# Patient Record
Sex: Female | Born: 1965 | Race: Black or African American | Hispanic: No | Marital: Married | Smoking: Former smoker
Health system: Southern US, Community
[De-identification: ages and names within clinical notes are randomized; demographics above are authoritative.]

## PROBLEM LIST (undated history)

## (undated) DIAGNOSIS — F410 Panic disorder [episodic paroxysmal anxiety] without agoraphobia: Secondary | ICD-10-CM

## (undated) DIAGNOSIS — F419 Anxiety disorder, unspecified: Secondary | ICD-10-CM

## (undated) HISTORY — PX: OTHER SURGICAL HISTORY: SHX169

## (undated) HISTORY — PX: CARPAL TUNNEL RELEASE: SHX101

---

## 1997-11-15 ENCOUNTER — Inpatient Hospital Stay (HOSPITAL_COMMUNITY): Admission: AD | Admit: 1997-11-15 | Discharge: 1997-11-15 | Payer: Self-pay | Admitting: Obstetrics & Gynecology

## 1998-05-14 ENCOUNTER — Emergency Department (HOSPITAL_COMMUNITY): Admission: EM | Admit: 1998-05-14 | Discharge: 1998-05-14 | Payer: Self-pay | Admitting: Emergency Medicine

## 1998-08-07 ENCOUNTER — Encounter: Admission: RE | Admit: 1998-08-07 | Discharge: 1998-08-07 | Payer: Self-pay | Admitting: Family Medicine

## 1998-12-10 ENCOUNTER — Inpatient Hospital Stay (HOSPITAL_COMMUNITY): Admission: AD | Admit: 1998-12-10 | Discharge: 1998-12-10 | Payer: Self-pay | Admitting: *Deleted

## 1998-12-18 ENCOUNTER — Encounter: Admission: RE | Admit: 1998-12-18 | Discharge: 1998-12-18 | Payer: Self-pay | Admitting: Family Medicine

## 1999-01-07 ENCOUNTER — Other Ambulatory Visit: Admission: RE | Admit: 1999-01-07 | Discharge: 1999-01-07 | Payer: Self-pay

## 1999-01-07 ENCOUNTER — Encounter: Admission: RE | Admit: 1999-01-07 | Discharge: 1999-01-07 | Payer: Self-pay | Admitting: Family Medicine

## 1999-02-03 ENCOUNTER — Encounter: Admission: RE | Admit: 1999-02-03 | Discharge: 1999-02-03 | Payer: Self-pay | Admitting: Family Medicine

## 1999-02-06 ENCOUNTER — Ambulatory Visit (HOSPITAL_COMMUNITY): Admission: RE | Admit: 1999-02-06 | Discharge: 1999-02-06 | Payer: Self-pay | Admitting: *Deleted

## 1999-02-18 ENCOUNTER — Encounter: Admission: RE | Admit: 1999-02-18 | Discharge: 1999-02-18 | Payer: Self-pay | Admitting: Family Medicine

## 1999-03-30 ENCOUNTER — Observation Stay (HOSPITAL_COMMUNITY): Admission: AD | Admit: 1999-03-30 | Discharge: 1999-03-31 | Payer: Self-pay | Admitting: Obstetrics

## 1999-04-03 ENCOUNTER — Encounter: Admission: RE | Admit: 1999-04-03 | Discharge: 1999-04-03 | Payer: Self-pay | Admitting: Family Medicine

## 1999-04-03 ENCOUNTER — Ambulatory Visit (HOSPITAL_COMMUNITY): Admission: RE | Admit: 1999-04-03 | Discharge: 1999-04-03 | Payer: Self-pay | Admitting: Obstetrics

## 1999-04-17 ENCOUNTER — Encounter: Admission: RE | Admit: 1999-04-17 | Discharge: 1999-04-17 | Payer: Self-pay | Admitting: Family Medicine

## 1999-05-02 ENCOUNTER — Encounter: Admission: RE | Admit: 1999-05-02 | Discharge: 1999-05-02 | Payer: Self-pay | Admitting: Family Medicine

## 1999-05-16 ENCOUNTER — Encounter: Admission: RE | Admit: 1999-05-16 | Discharge: 1999-05-16 | Payer: Self-pay | Admitting: Family Medicine

## 1999-05-20 ENCOUNTER — Inpatient Hospital Stay (HOSPITAL_COMMUNITY): Admission: AD | Admit: 1999-05-20 | Discharge: 1999-05-28 | Payer: Self-pay | Admitting: Obstetrics

## 1999-05-20 ENCOUNTER — Encounter: Payer: Self-pay | Admitting: *Deleted

## 1999-05-23 ENCOUNTER — Encounter: Payer: Self-pay | Admitting: *Deleted

## 1999-05-30 ENCOUNTER — Encounter: Admission: RE | Admit: 1999-05-30 | Discharge: 1999-05-30 | Payer: Self-pay | Admitting: Family Medicine

## 1999-06-05 ENCOUNTER — Encounter: Admission: RE | Admit: 1999-06-05 | Discharge: 1999-06-05 | Payer: Self-pay | Admitting: Obstetrics

## 1999-06-12 ENCOUNTER — Inpatient Hospital Stay (HOSPITAL_COMMUNITY): Admission: AD | Admit: 1999-06-12 | Discharge: 1999-06-12 | Payer: Self-pay | Admitting: Obstetrics & Gynecology

## 1999-06-12 ENCOUNTER — Encounter: Admission: RE | Admit: 1999-06-12 | Discharge: 1999-06-12 | Payer: Self-pay | Admitting: Obstetrics

## 1999-06-15 ENCOUNTER — Encounter (INDEPENDENT_AMBULATORY_CARE_PROVIDER_SITE_OTHER): Payer: Self-pay | Admitting: Specialist

## 1999-06-15 ENCOUNTER — Encounter: Payer: Self-pay | Admitting: Obstetrics & Gynecology

## 1999-06-15 ENCOUNTER — Inpatient Hospital Stay (HOSPITAL_COMMUNITY): Admission: AD | Admit: 1999-06-15 | Discharge: 1999-06-18 | Payer: Self-pay | Admitting: Obstetrics & Gynecology

## 1999-11-06 ENCOUNTER — Encounter: Admission: RE | Admit: 1999-11-06 | Discharge: 1999-11-06 | Payer: Self-pay | Admitting: Family Medicine

## 2000-10-11 ENCOUNTER — Emergency Department (HOSPITAL_COMMUNITY): Admission: EM | Admit: 2000-10-11 | Discharge: 2000-10-12 | Payer: Self-pay | Admitting: Emergency Medicine

## 2002-07-07 ENCOUNTER — Encounter: Admission: RE | Admit: 2002-07-07 | Discharge: 2002-07-07 | Payer: Self-pay | Admitting: Family Medicine

## 2002-07-07 ENCOUNTER — Other Ambulatory Visit: Admission: RE | Admit: 2002-07-07 | Discharge: 2002-07-28 | Payer: Self-pay | Admitting: Family Medicine

## 2002-07-10 ENCOUNTER — Encounter: Admission: RE | Admit: 2002-07-10 | Discharge: 2002-07-10 | Payer: Self-pay | Admitting: Family Medicine

## 2003-08-11 ENCOUNTER — Emergency Department (HOSPITAL_COMMUNITY): Admission: EM | Admit: 2003-08-11 | Discharge: 2003-08-11 | Payer: Self-pay | Admitting: Emergency Medicine

## 2003-10-03 ENCOUNTER — Encounter: Admission: RE | Admit: 2003-10-03 | Discharge: 2003-10-03 | Payer: Self-pay | Admitting: Family Medicine

## 2003-10-11 ENCOUNTER — Encounter: Admission: RE | Admit: 2003-10-11 | Discharge: 2003-10-11 | Payer: Self-pay | Admitting: Sports Medicine

## 2004-02-15 ENCOUNTER — Other Ambulatory Visit: Admission: RE | Admit: 2004-02-15 | Discharge: 2004-02-15 | Payer: Self-pay | Admitting: Sports Medicine

## 2004-02-15 ENCOUNTER — Other Ambulatory Visit: Admission: RE | Admit: 2004-02-15 | Discharge: 2004-02-15 | Payer: Self-pay | Admitting: Family Medicine

## 2004-02-15 ENCOUNTER — Encounter (INDEPENDENT_AMBULATORY_CARE_PROVIDER_SITE_OTHER): Payer: Self-pay | Admitting: Specialist

## 2004-02-15 ENCOUNTER — Encounter: Admission: RE | Admit: 2004-02-15 | Discharge: 2004-02-15 | Payer: Self-pay | Admitting: Sports Medicine

## 2004-04-01 ENCOUNTER — Emergency Department (HOSPITAL_COMMUNITY): Admission: EM | Admit: 2004-04-01 | Discharge: 2004-04-01 | Payer: Self-pay | Admitting: Emergency Medicine

## 2004-05-13 ENCOUNTER — Encounter: Admission: RE | Admit: 2004-05-13 | Discharge: 2004-05-13 | Payer: Self-pay | Admitting: Family Medicine

## 2004-11-11 ENCOUNTER — Emergency Department (HOSPITAL_COMMUNITY): Admission: EM | Admit: 2004-11-11 | Discharge: 2004-11-11 | Payer: Self-pay | Admitting: Emergency Medicine

## 2005-08-30 ENCOUNTER — Inpatient Hospital Stay (HOSPITAL_COMMUNITY): Admission: AD | Admit: 2005-08-30 | Discharge: 2005-08-30 | Payer: Self-pay | Admitting: *Deleted

## 2006-01-02 ENCOUNTER — Encounter (INDEPENDENT_AMBULATORY_CARE_PROVIDER_SITE_OTHER): Payer: Self-pay | Admitting: *Deleted

## 2006-01-02 LAB — CONVERTED CEMR LAB

## 2006-01-07 ENCOUNTER — Ambulatory Visit: Payer: Self-pay | Admitting: Family Medicine

## 2006-01-07 ENCOUNTER — Encounter (INDEPENDENT_AMBULATORY_CARE_PROVIDER_SITE_OTHER): Payer: Self-pay | Admitting: Specialist

## 2006-11-25 DIAGNOSIS — N879 Dysplasia of cervix uteri, unspecified: Secondary | ICD-10-CM | POA: Insufficient documentation

## 2006-11-25 DIAGNOSIS — R42 Dizziness and giddiness: Secondary | ICD-10-CM

## 2006-11-25 DIAGNOSIS — F17200 Nicotine dependence, unspecified, uncomplicated: Secondary | ICD-10-CM | POA: Insufficient documentation

## 2006-11-25 DIAGNOSIS — F411 Generalized anxiety disorder: Secondary | ICD-10-CM | POA: Insufficient documentation

## 2006-11-25 HISTORY — DX: Dizziness and giddiness: R42

## 2006-11-26 ENCOUNTER — Encounter (INDEPENDENT_AMBULATORY_CARE_PROVIDER_SITE_OTHER): Payer: Self-pay | Admitting: *Deleted

## 2007-01-17 ENCOUNTER — Telehealth (INDEPENDENT_AMBULATORY_CARE_PROVIDER_SITE_OTHER): Payer: Self-pay | Admitting: *Deleted

## 2007-01-18 ENCOUNTER — Encounter: Payer: Self-pay | Admitting: Family Medicine

## 2007-01-18 ENCOUNTER — Encounter (INDEPENDENT_AMBULATORY_CARE_PROVIDER_SITE_OTHER): Payer: Self-pay | Admitting: *Deleted

## 2007-01-18 ENCOUNTER — Ambulatory Visit: Payer: Self-pay | Admitting: Sports Medicine

## 2007-01-18 ENCOUNTER — Encounter: Payer: Self-pay | Admitting: *Deleted

## 2007-01-18 DIAGNOSIS — N898 Other specified noninflammatory disorders of vagina: Secondary | ICD-10-CM | POA: Insufficient documentation

## 2007-01-18 DIAGNOSIS — N926 Irregular menstruation, unspecified: Secondary | ICD-10-CM | POA: Insufficient documentation

## 2007-01-18 DIAGNOSIS — N879 Dysplasia of cervix uteri, unspecified: Secondary | ICD-10-CM | POA: Insufficient documentation

## 2007-01-18 HISTORY — DX: Irregular menstruation, unspecified: N92.6

## 2007-01-18 LAB — CONVERTED CEMR LAB
Beta hcg, urine, semiquantitative: NEGATIVE
Whiff Test: POSITIVE

## 2007-01-24 ENCOUNTER — Encounter: Payer: Self-pay | Admitting: *Deleted

## 2007-02-07 ENCOUNTER — Telehealth: Payer: Self-pay | Admitting: *Deleted

## 2007-02-09 ENCOUNTER — Ambulatory Visit: Payer: Self-pay | Admitting: Obstetrics & Gynecology

## 2007-02-14 ENCOUNTER — Ambulatory Visit: Payer: Self-pay | Admitting: Sports Medicine

## 2007-02-14 ENCOUNTER — Encounter (INDEPENDENT_AMBULATORY_CARE_PROVIDER_SITE_OTHER): Payer: Self-pay | Admitting: *Deleted

## 2007-02-14 DIAGNOSIS — F3289 Other specified depressive episodes: Secondary | ICD-10-CM | POA: Insufficient documentation

## 2007-02-14 DIAGNOSIS — R634 Abnormal weight loss: Secondary | ICD-10-CM

## 2007-02-14 DIAGNOSIS — F329 Major depressive disorder, single episode, unspecified: Secondary | ICD-10-CM

## 2007-02-14 LAB — CONVERTED CEMR LAB
ALT: 17 units/L (ref 0–35)
AST: 20 units/L (ref 0–37)
Albumin: 4.6 g/dL (ref 3.5–5.2)
Alkaline Phosphatase: 61 units/L (ref 39–117)
BUN: 12 mg/dL (ref 6–23)
CO2: 21 meq/L (ref 19–32)
Calcium: 9.6 mg/dL (ref 8.4–10.5)
Chloride: 105 meq/L (ref 96–112)
Cholesterol: 132 mg/dL (ref 0–200)
Creatinine, Ser: 0.96 mg/dL (ref 0.40–1.20)
Glucose, Bld: 113 mg/dL — ABNORMAL HIGH (ref 70–99)
HCT: 36 %
HDL: 41 mg/dL (ref >39)
Hemoglobin: 12.1 g/dL
LDL Cholesterol: 71 mg/dL (ref 0–99)
MCV: 85.3 fL
Platelets: 229 10*3/uL
Potassium: 4.1 meq/L (ref 3.5–5.3)
RBC: 4.22 M/uL
Sodium: 139 meq/L (ref 135–145)
TSH: 1.664 microintl units/mL (ref 0.350–5.50)
Total Bilirubin: 0.3 mg/dL (ref 0.3–1.2)
Total CHOL/HDL Ratio: 3.2
Total Protein: 7.1 g/dL (ref 6.0–8.3)
Triglycerides: 98 mg/dL (ref <150)
VLDL: 20 mg/dL (ref 0–40)
WBC: 7 10*3/uL

## 2007-02-25 ENCOUNTER — Ambulatory Visit (HOSPITAL_COMMUNITY): Admission: RE | Admit: 2007-02-25 | Discharge: 2007-02-25 | Payer: Self-pay | Admitting: Obstetrics & Gynecology

## 2007-02-25 ENCOUNTER — Ambulatory Visit: Payer: Self-pay | Admitting: Obstetrics & Gynecology

## 2007-02-25 ENCOUNTER — Encounter: Payer: Self-pay | Admitting: Obstetrics & Gynecology

## 2007-03-07 ENCOUNTER — Inpatient Hospital Stay (HOSPITAL_COMMUNITY): Admission: AD | Admit: 2007-03-07 | Discharge: 2007-03-07 | Payer: Self-pay | Admitting: Obstetrics and Gynecology

## 2007-03-07 ENCOUNTER — Ambulatory Visit: Payer: Self-pay | Admitting: Obstetrics and Gynecology

## 2007-03-15 ENCOUNTER — Ambulatory Visit (HOSPITAL_COMMUNITY): Admission: AD | Admit: 2007-03-15 | Discharge: 2007-03-15 | Payer: Self-pay | Admitting: Obstetrics and Gynecology

## 2007-03-15 ENCOUNTER — Ambulatory Visit: Payer: Self-pay | Admitting: Obstetrics and Gynecology

## 2007-03-15 ENCOUNTER — Encounter: Payer: Self-pay | Admitting: *Deleted

## 2007-03-17 ENCOUNTER — Ambulatory Visit: Payer: Self-pay | Admitting: Obstetrics and Gynecology

## 2007-03-23 ENCOUNTER — Ambulatory Visit: Payer: Self-pay | Admitting: Obstetrics and Gynecology

## 2007-06-18 ENCOUNTER — Emergency Department (HOSPITAL_COMMUNITY): Admission: EM | Admit: 2007-06-18 | Discharge: 2007-06-18 | Payer: Self-pay | Admitting: Family Medicine

## 2009-08-23 ENCOUNTER — Emergency Department (HOSPITAL_COMMUNITY): Admission: EM | Admit: 2009-08-23 | Discharge: 2009-08-23 | Payer: Self-pay | Admitting: Family Medicine

## 2009-08-30 ENCOUNTER — Emergency Department (HOSPITAL_COMMUNITY): Admission: EM | Admit: 2009-08-30 | Discharge: 2009-08-30 | Payer: Self-pay | Admitting: Emergency Medicine

## 2009-12-12 ENCOUNTER — Emergency Department (HOSPITAL_COMMUNITY): Admission: EM | Admit: 2009-12-12 | Discharge: 2009-12-12 | Payer: Self-pay | Admitting: Emergency Medicine

## 2010-08-05 ENCOUNTER — Emergency Department (HOSPITAL_COMMUNITY): Admission: EM | Admit: 2010-08-05 | Discharge: 2010-08-05 | Payer: Self-pay | Admitting: Emergency Medicine

## 2010-10-05 ENCOUNTER — Emergency Department (HOSPITAL_COMMUNITY)
Admission: EM | Admit: 2010-10-05 | Discharge: 2010-10-05 | Payer: Self-pay | Source: Home / Self Care | Admitting: Family Medicine

## 2010-12-19 LAB — POCT CARDIAC MARKERS
Myoglobin, poc: 64.2 ng/mL (ref 12–200)
Troponin i, poc: <0.05 ng/mL (ref 0.00–0.09)

## 2010-12-30 LAB — POCT I-STAT, CHEM 8
BUN: 11 mg/dL (ref 6–23)
Creatinine, Ser: 0.7 mg/dL (ref 0.4–1.2)
Sodium: 139 mEq/L (ref 135–145)
TCO2: 25 mmol/L (ref 0–100)

## 2011-02-10 NOTE — Op Note (Signed)
Wendy Huerta, Wendy Huerta               ACCOUNT NO.:  [id number]   MEDICAL RECORD NO.:  [id number]          PATIENT TYPE:  AMB   LOCATION:  SDC                           [facility]:  [facility]   PHYSICIAN:  Allie Bossier, MD        DATE OF BIRTH:  September 16, 1966   DATE OF PROCEDURE:  02/25/2007  DATE OF DISCHARGE:                               OPERATIVE REPORT   PREOPERATIVE DIAGNOSES:  Recurrent high grade cervical dysplasia.   POSTOPERATIVE DIAGNOSES:  Recurrent high grade cervical dysplasia.   OPERATIVE PROCEDURE:  Cold knife conization, endocervical curettage.   SURGEON:  Allie Bossier, MD   ANESTHESIA:  Harrie Foreman, Sherron Ales, M.D.   COMPLICATIONS:  None.   ESTIMATED BLOOD LOSS:  Minimal.   SPECIMENS:  Cervical curettings and cone biopsy of cervix.   DESCRIPTION OF PROCEDURE:  The risks, benefits and alternatives of the  surgery were explained, understood, and accepted.  Consents were signed.  We discussed the case once more preoperatively and she had no questions.  In the operating room she was placed in the dorsal lithotomy position.  LMA anesthesia was placed without complication.  Her vagina was prepped  and draped in the usual sterile fashion.  A weighted speculum was placed  posteriorly and a Deaver anteriorly.  Lugol's solution was applied to  the cervix.  No non-staining lesions were visualized.  A figure-of-eight  suture using 0 chromic suture was placed at the 3 o'clock and 9 o'clock  positions.  These were tagged and held.  Using a cervical blade, a cone  shaped portion of the cervix was removed.  Large Nabothian cysts were  noted.  Endocervical curettings were obtained. The cone bed was  cauterized.  Excellent hemostasis was noted.  The cone bed was closed  with a pursestring suture everting the cervical os.  Excellent  hemostasis was again noted.  She was extubated and taken to the recovery  room in stable condition with the instruments, sponge and needle counts  correct.      Allie Bossier, MD  Electronically Signed     MCD/MEDQ  D:  02/25/2007  T:  02/25/2007  Job:  [id number]

## 2011-02-10 NOTE — H&P (Signed)
NAMEJENEAN, ESCANDON               ACCOUNT NO.:  [id number]   MEDICAL RECORD NO.:  [id number]          PATIENT TYPE:  AMB   LOCATION:                                [facility]   PHYSICIAN:  Allie Bossier, MD        DATE OF BIRTH:  1966/02/03   DATE OF ADMISSION:  02/25/2007  DATE OF DISCHARGE:                              HISTORY & PHYSICAL   IDENTIFICATION:  Ms. Chai is a 45 year old married black gravida 3,  para 3, with an LMP of Jan 31, 2007.  She has been followed for several  years at the [facility] with recurrent high-grade  dysplasia on her Pap smears.  Beginning in 2005 she had high-grade  dysplasia on a Pap smear and a biopsy and a positive endocervical  curettage.  No treatment was done at that time, following in 2007 and  most recently April 2008.  She has continued to have high-grade  dysplasia on her Pap smears.  She arrived today thinking that she was  having surgery today and was rather annoyed when she found out this was  a preoperative visit.  However, after discussing the surgery she  understands the necessity of a preop visit.   PAST MEDICAL HISTORY:  1. Anxiety.  2. High-grade dysplasia.  3. Smoking.   PAST SURGICAL HISTORY:  Cesarean section x2.  She is unsure if she had  her tubes tied with her last C-section in 2002, but I will obtain these  records as soon as possible.   REVIEW OF SYSTEMS:  She is employed at [employer].  She is married.   Latex allergies:  None.  No known drug allergies.   SOCIAL HISTORY:  She smokes a half-pack a day for the last 10 years.  She also reports daily alcohol.  She reports less than 2 drinks a day.   FAMILY HISTORY:  Negative for breast, GYN and colon malignancies but  positive for diabetes and hypertension.   ASSESSMENT AND PLAN:  Recurrent high-grade dysplasia.  We will plan for  a cone biopsy.  Please note, she has had a positive ECC in 2005.  The  risks of surgery were explained, understood and  accepted.      Allie Bossier, MD  Electronically Signed     MCD/MEDQ  D:  02/09/2007  T:  02/09/2007  Job:  [id number]

## 2011-02-10 NOTE — Op Note (Signed)
Wendy Huerta, Wendy Huerta               ACCOUNT NO.:  [id number]   MEDICAL RECORD NO.:  [id number]          PATIENT TYPE:  AMB   LOCATION:  SDC                           [facility]   PHYSICIAN:  Phil D. Okey Dupre, M.D.     DATE OF BIRTH:  10/17/1965   DATE OF PROCEDURE:  03/15/2007  DATE OF DISCHARGE:                               OPERATIVE REPORT   PROCEDURE:  Resuturing of surgical cone site.   PREOPERATIVE DIAGNOSIS:  Postoperative bleeding 3 weeks post conization  of the cervix.   POSTOPERATIVE DIAGNOSIS:  Postoperative bleeding 3 weeks post conization  of the cervix.   SURGEON:  Dr. Okey Dupre   ANESTHESIA:  General.   SPECIMENS TO PATHOLOGY:  None.   POSTOPERATIVE CONDITION:  Satisfactory.   ESTIMATED BLOOD LOSS:  100 mL.   OPERATIVE FINDINGS:  On examining the patient after an attempt in the  MAU had been to conservatively stop the bleeding using Monsel's solution  and Surgicel was not successful, the patient was taken to the operating  room and the cervix easily admitted a fingertip to the internal os.  There was marked bleeding from the external os and the clots in the  vaginal vault, and the most of bleeding seemed to be coming from the  posterior lip of the cervix.  The area did observe to be clean with no  signs of infection that could be visually noted.   The procedure went as follows:  Under satisfactory general anesthesia  with the patient in dorsal lithotomy position, perineum and vagina  prepped and draped in the usual sterile manner.  Weighted speculum  placed in the posterior portion of the vagina.  Anterior lip of cervix  was grasped with a ring forceps.  The findings were as above.  The  entire circumference of the cervix was superficially run externally to  the cervix with a pursestring 3 cm from the distal end of the cervix  using 1 chromic catgut suture and an atraumatic needle.  This was  snugged down tightly and the suture cut short.  There seemed to still  be  some oozing from posterior lip of the cervix, and two figure-of-eight  sutures were used in that area.  There appeared to be no bleeding at the  end of the procedure.  A #1 chromic catgut was used for the  aforementioned sutures.  The speculum was removed from vagina, and the  patient transferred to the recovery room in satisfactory condition,  having tolerated the procedure well.      Phil D. Okey Dupre, M.D.  Electronically Signed    PDR/MEDQ  D:  03/15/2007  T:  03/15/2007  Job:  [id number]

## 2011-07-15 LAB — DIC (DISSEMINATED INTRAVASCULAR COAGULATION)PANEL
INR: 1.2
Prothrombin Time: 15.8 — ABNORMAL HIGH
Smear Review: NONE SEEN

## 2011-07-15 LAB — CBC
HCT: 33.9 — ABNORMAL LOW
MCHC: 33.3
MCV: 85.8
Platelets: 206
RDW: 13.3

## 2011-07-15 LAB — POCT PREGNANCY, URINE: Operator id: 251141

## 2011-12-15 ENCOUNTER — Other Ambulatory Visit: Payer: Self-pay | Admitting: Obstetrics and Gynecology

## 2011-12-15 DIAGNOSIS — Z1231 Encounter for screening mammogram for malignant neoplasm of breast: Secondary | ICD-10-CM

## 2011-12-31 ENCOUNTER — Ambulatory Visit
Admission: RE | Admit: 2011-12-31 | Discharge: 2011-12-31 | Disposition: A | Discharge status: Home / Self Care | Payer: BC Managed Care – PPO | Source: Ambulatory Visit | Attending: Obstetrics and Gynecology | Admitting: Obstetrics and Gynecology

## 2011-12-31 DIAGNOSIS — Z1231 Encounter for screening mammogram for malignant neoplasm of breast: Secondary | ICD-10-CM

## 2013-03-16 ENCOUNTER — Emergency Department (HOSPITAL_COMMUNITY)
Admission: EM | Admit: 2013-03-16 | Discharge: 2013-03-16 | Disposition: A | Discharge status: Home / Self Care | Payer: BC Managed Care – PPO | Attending: Emergency Medicine | Admitting: Emergency Medicine

## 2013-03-16 ENCOUNTER — Encounter (HOSPITAL_COMMUNITY): Payer: Self-pay | Admitting: Emergency Medicine

## 2013-03-16 DIAGNOSIS — J3489 Other specified disorders of nose and nasal sinuses: Secondary | ICD-10-CM | POA: Insufficient documentation

## 2013-03-16 DIAGNOSIS — R5383 Other fatigue: Secondary | ICD-10-CM | POA: Insufficient documentation

## 2013-03-16 DIAGNOSIS — R5381 Other malaise: Secondary | ICD-10-CM | POA: Insufficient documentation

## 2013-03-16 DIAGNOSIS — J029 Acute pharyngitis, unspecified: Secondary | ICD-10-CM | POA: Insufficient documentation

## 2013-03-16 DIAGNOSIS — J40 Bronchitis, not specified as acute or chronic: Secondary | ICD-10-CM | POA: Insufficient documentation

## 2013-03-16 DIAGNOSIS — R6889 Other general symptoms and signs: Secondary | ICD-10-CM | POA: Insufficient documentation

## 2013-03-16 DIAGNOSIS — R071 Chest pain on breathing: Secondary | ICD-10-CM | POA: Insufficient documentation

## 2013-03-16 DIAGNOSIS — R509 Fever, unspecified: Secondary | ICD-10-CM | POA: Insufficient documentation

## 2013-03-16 DIAGNOSIS — F17200 Nicotine dependence, unspecified, uncomplicated: Secondary | ICD-10-CM | POA: Insufficient documentation

## 2013-03-16 MED ORDER — HYDROCOD POLST-CHLORPHEN POLST 10-8 MG/5ML PO LQCR: 5.0000 mL | Freq: Two times a day (BID) | ORAL | Status: DC | PRN

## 2013-03-16 MED ORDER — ALBUTEROL SULFATE HFA 108 (90 BASE) MCG/ACT IN AERS
2.0000 | INHALATION_SPRAY | Freq: Once | RESPIRATORY_TRACT | Status: AC
  Administered 2013-03-16: 2 via RESPIRATORY_TRACT
  Filled 2013-03-16: qty 6.7

## 2013-03-16 MED ORDER — BENZONATATE 100 MG PO CAPS: 100.0000 mg | ORAL_CAPSULE | Freq: Three times a day (TID) | ORAL | Status: DC

## 2013-03-16 NOTE — ED Notes (Signed)
States has been "sick" for 8 days. "I think I have the flu". C/o sore throat, cough, weakness and decreased appetite. No distress. Lungs clear.

## 2013-03-16 NOTE — ED Provider Notes (Signed)
History     CSN: [id number]  Arrival date & time 03/16/13  4098   First MD Initiated Contact with Patient 03/16/13 1003      CC: cough  (Consider location/radiation/quality/duration/timing/severity/associated sxs/prior treatment) HPI Comments: Patient presents with chief complaint of illness for 8 days. Patient states that at the onset she began having subjective fever, chills, cough, sneezing. Upper respiratory tract infection symptoms have generally improved however her cough remains. She has taken NyQuil without relief. She continues to have sore throat that is improved with over-the-counter medications. No nausea, vomiting, or diarrhea. Patient complains of being fatigued. Onset of symptoms acute. Course is constant. Nothing makes symptoms worse.  The history is provided by the patient.    History reviewed. No pertinent past medical history.  Past Surgical History  Procedure Laterality Date  . Endometrial ablasion    . Cesarean section      No family history on file.  History  Substance Use Topics  . Smoking status: Current Every Day Smoker  . Smokeless tobacco: Not on file  . Alcohol Use: Yes     Comment: occasinally    OB History   Grav Para Term Preterm Abortions TAB SAB Ect Mult Living                  Review of Systems  Constitutional: Positive for fever, chills and fatigue.  HENT: Positive for congestion, sore throat, rhinorrhea and sneezing. Negative for ear pain, neck stiffness and sinus pressure.   Eyes: Negative for redness.  Respiratory: Positive for cough. Negative for shortness of breath and wheezing.   Cardiovascular: Positive for chest pain (when coughing).  Gastrointestinal: Negative for nausea, vomiting, abdominal pain and diarrhea.  Genitourinary: Negative for dysuria.  Musculoskeletal: Negative for myalgias.  Skin: Negative for rash.  Neurological: Negative for headaches.  Hematological: Negative for adenopathy.    Allergies   Codeine  Home Medications   Current Outpatient Rx  Name  Route  Sig  Dispense  Refill  . benzonatate (TESSALON) 100 MG capsule   Oral   Take 1 capsule (100 mg total) by mouth every 8 (eight) hours.   15 capsule   0   . chlorpheniramine-HYDROcodone (TUSSIONEX PENNKINETIC ER) 10-8 MG/5ML LQCR   Oral   Take 5 mLs by mouth every 12 (twelve) hours as needed.   115 mL   0     BP 116/87  Pulse 87  Temp(Src) 98.4 F (36.9 C) (Oral)  SpO2 100%  Physical Exam  Nursing note and vitals reviewed. Constitutional: She appears well-developed and well-nourished.  HENT:  Head: Normocephalic and atraumatic. No trismus in the jaw.  Right Ear: Tympanic membrane, external ear and ear canal normal.  Left Ear: Tympanic membrane, external ear and ear canal normal.  Nose: Nose normal. No mucosal edema or rhinorrhea.  Mouth/Throat: Uvula is midline, oropharynx is clear and moist and mucous membranes are normal. Mucous membranes are not dry. No oral lesions. No edematous. No oropharyngeal exudate, posterior oropharyngeal edema, posterior oropharyngeal erythema or tonsillar abscesses.  Eyes: Conjunctivae are normal. Pupils are equal, round, and reactive to light. Right eye exhibits no discharge. Left eye exhibits no discharge.  Neck: Normal range of motion. Neck supple.  Cardiovascular: Normal rate, regular rhythm and normal heart sounds.   Pulmonary/Chest: Effort normal and breath sounds normal. No respiratory distress. She has no wheezes. She has no rales.  Abdominal: Soft. There is no tenderness.  Lymphadenopathy:    She has no cervical adenopathy.  Neurological: She is alert.  Skin: Skin is warm and dry.  Psychiatric: She has a normal mood and affect.    ED Course  Procedures (including critical care time)  Labs Reviewed - No data to display No results found.   1. Bronchitis     10:42 AM Patient seen and examined. Work-up initiated. Medications ordered.   Vital signs reviewed and  are as follows: Filed Vitals:   03/16/13 1002  BP: 116/87  Pulse: 87  Temp: 98.4 F (36.9 C)   10:48 AM Patient counseled on use of albuterol HFA.  Told to use 1-2 puffs q 4 hours as needed for SOB.  Patient counseled on supportive care and s/s to return including worsening symptoms, persistent fever, persistent vomiting, or if they have any other concerns.  Urged to see PCP if symptoms persist for more than 3 days. Patient verbalizes understanding and agrees with plan.     MDM  Patient with likely viral bronchitis. No concern for PNA given normal lung exam. Antibiotics not indicated. Conservative therapy indicated.        Renne Crigler, PA-C 03/16/13 1048

## 2013-03-16 NOTE — ED Provider Notes (Signed)
Medical screening examination/treatment/procedure(s) were performed by non-physician practitioner and as supervising physician I was immediately available for consultation/collaboration.  Jennavieve Arrick, MD 03/16/13 1542 

## 2015-08-02 ENCOUNTER — Emergency Department (HOSPITAL_COMMUNITY)
Admission: EM | Admit: 2015-08-02 | Discharge: 2015-08-02 | Disposition: A | Discharge status: Home / Self Care | Payer: Self-pay | Attending: Emergency Medicine | Admitting: Emergency Medicine

## 2015-08-02 ENCOUNTER — Encounter (HOSPITAL_COMMUNITY): Payer: Self-pay | Admitting: Emergency Medicine

## 2015-08-02 DIAGNOSIS — Z720 Tobacco use: Secondary | ICD-10-CM | POA: Insufficient documentation

## 2015-08-02 DIAGNOSIS — F410 Panic disorder [episodic paroxysmal anxiety] without agoraphobia: Secondary | ICD-10-CM | POA: Insufficient documentation

## 2015-08-02 HISTORY — DX: Panic disorder (episodic paroxysmal anxiety): F41.0

## 2015-08-02 HISTORY — DX: Anxiety disorder, unspecified: F41.9

## 2015-08-02 MED ORDER — LORAZEPAM 0.5 MG PO TABS
1.0000 mg | ORAL_TABLET | Freq: Once | ORAL | Status: AC
  Administered 2015-08-02: 1 mg via ORAL
  Filled 2015-08-02: qty 2

## 2015-08-02 MED ORDER — LORAZEPAM 1 MG PO TABS: 1.0000 mg | ORAL_TABLET | Freq: Three times a day (TID) | ORAL | Status: DC | PRN

## 2015-08-02 NOTE — ED Notes (Signed)
Pt. reports panic attack last Thursday and again today " feeling hot " inside , pt. has not taken her Paxil for 1 year.

## 2015-08-02 NOTE — Discharge Instructions (Signed)
Panic Attacks °Panic attacks are sudden, short-lived surges of severe anxiety, fear, or discomfort. They may occur for no reason when you are relaxed, when you are anxious, or when you are sleeping. Panic attacks may occur for a number of reasons:  °· Healthy people occasionally have panic attacks in extreme, life-threatening situations, such as war or natural disasters. Normal anxiety is a protective mechanism of the body that helps us react to danger (fight or flight response). °· Panic attacks are often seen with anxiety disorders, such as panic disorder, social anxiety disorder, generalized anxiety disorder, and phobias. Anxiety disorders cause excessive or uncontrollable anxiety. They may interfere with your relationships or other life activities. °· Panic attacks are sometimes seen with other mental illnesses, such as depression and posttraumatic stress disorder. °· Certain medical conditions, prescription medicines, and drugs of abuse can cause panic attacks. °SYMPTOMS  °Panic attacks start suddenly, peak within 20 minutes, and are accompanied by four or more of the following symptoms: °· Pounding heart or fast heart rate (palpitations). °· Sweating. °· Trembling or shaking. °· Shortness of breath or feeling smothered. °· Feeling choked. °· Chest pain or discomfort. °· Nausea or strange feeling in your stomach. °· Dizziness, light-headedness, or feeling like you will faint. °· Chills or hot flushes. °· Numbness or tingling in your lips or hands and feet. °· Feeling that things are not real or feeling that you are not yourself. °· Fear of losing control or going crazy. °· Fear of dying. °Some of these symptoms can mimic serious medical conditions. For example, you may think you are having a heart attack. Although panic attacks can be very scary, they are not life threatening. °DIAGNOSIS  °Panic attacks are diagnosed through an assessment by your health care provider. Your health care provider will ask  questions about your symptoms, such as where and when they occurred. Your health care provider will also ask about your medical history and use of alcohol and drugs, including prescription medicines. Your health care provider may order blood tests or other studies to rule out a serious medical condition. Your health care provider may refer you to a mental health professional for further evaluation. °TREATMENT  °· Most healthy people who have one or two panic attacks in an extreme, life-threatening situation will not require treatment. °· The treatment for panic attacks associated with anxiety disorders or other mental illness typically involves counseling with a mental health professional, medicine, or a combination of both. Your health care provider will help determine what treatment is best for you. °· Panic attacks due to physical illness usually go away with treatment of the illness. If prescription medicine is causing panic attacks, talk with your health care provider about stopping the medicine, decreasing the dose, or substituting another medicine. °· Panic attacks due to alcohol or drug abuse go away with abstinence. Some adults need professional help in order to stop drinking or using drugs. °HOME CARE INSTRUCTIONS  °· Take all medicines as directed by your health care provider.   °· Schedule and attend follow-up visits as directed by your health care provider. It is important to keep all your appointments. °SEEK MEDICAL CARE IF: °· You are not able to take your medicines as prescribed. °· Your symptoms do not improve or get worse. °SEEK IMMEDIATE MEDICAL CARE IF:  °· You experience panic attack symptoms that are different than your usual symptoms. °· You have serious thoughts about hurting yourself or others. °· You are taking medicine for panic attacks and   have a serious side effect. MAKE SURE YOU:  Understand these instructions.  Will watch your condition.  Will get help right away if you are not  doing well or get worse.   This information is not intended to replace advice given to you by your health care provider. Make sure you discuss any questions you have with your health care provider.  Behavioral Health Resources in the [facility]  Intensive Outpatient Programs: [facility]      [address] [place], [place] [phone] Both a day and evening program       [facility]     [address]        [place], [place] [address] [phone]         [facility] [address] [place] [phone]  [facility] Mental Health ACCESS LINE: [phone] or [phone] [address] [place], [place] [address] [email or web address]  Mobile Crisis Teams:                                        [facility]         Mobile Crisis Care Unit [phone]             [facility] [address] Ginette Otto [phone]                                         [facility] [address] [place] [place] [phone]  Self-Help/Support Groups: [facility] [employer] of support groups [phone] (call for more info)  Narcotics Anonymous (NA) [facility] [address] [place] - 2 meetings at this location  Residential Treatment Programs:  [facility]      [address]        [place]       [phone]         [facility] [address], [place] [address] [place], [place]  [phone]  [facility]  [address] [place], [place] [address] [phone] Admissions: 8am-3pm M-F  [facility]     [address]        [place], [place] [address]       [phone]         [facility] [address] [place]s [place], [place] [phone]  [facility] [address] [place], [place] [phone]  [facility] - Intensive Outpatient      [address]     [place], [place]       [phone]         [facility]     [address] [place], [place] [phone] or [phone]  [facility] ([facility])  [address] [place], [place] [phone]  [facility] [address]                                               [address] [place] [phone]  [facility] Resources: [phone]  General Therapy                                                Angie FavaJulie Brannon, PhD        [address]                                       [place], [place] [address]         [phone]   Insurance  [facility]   [address] [place], [place] [phone]  [facility] [address] [place], [place] [address] [phone] Insurance/Medicaid/sponsorship through [facility]  [facility]                                              [address]                                        [place], [place] [address]    Therapy/tele-psych/case         [phone]          [facility] [address]   [place], [place]  [phone]  Adolescent/group home/case management [phone]                                           Creola CornJulia Brannon PhD       General therapy       Insurance   [phone]         Dr. Lolly MustacheArfeen Insurance [phone]- [phone] M-F  [place] [facility] Medicaid, sponsorship [phone]      Document Released: 09/14/2005 Document Revised: 09/19/2013 Document Reviewed: 04/28/2013 Elsevier Interactive Patient Education 2016 [employer]

## 2015-08-02 NOTE — ED Provider Notes (Signed)
CSN: [id number]     Arrival date & time 08/02/15  2001 History  By signing my name below, I, Jarvis Morganaylor Ferguson, attest that this documentation has been prepared under the direction and in the presence of Roxy Horsemanobert Kaede Clendenen, PA-C Electronically Signed: Jarvis Morganaylor Ferguson, ED Scribe. 08/02/2015. 8:20 PM.    Chief Complaint  Patient presents with  . Panic Attack    The history is provided by the patient. No language interpreter was used.    Claudean SeveranceCara W Brogden is a 49 y.o. female with a h/o anxiety and panic attacks who presents to the Emergency Department with a chief complaint of episodic panic attacks. Pt's husband states that she has had these for several years but they have started to gradually worsen over the past 3 days. Pt notes she was previously on Paxil but has been off the medication for 1 year. She notes she has lost 25 lbs over a period of 4 months and believes it to be due to her anxiety. She does not currently have a psychiatrist. Pt reports her PCP was previously prescribing the Paxil but she is no longer a patient of that doctor. Pt states she occasionally smokes marijuana but denies any other recreational drug use. She denies any alcohol use..She denies any SI or HI.   Past Medical History  Diagnosis Date  . Panic attacks   . Anxiety    Past Surgical History  Procedure Laterality Date  . Endometrial ablasion    . Cesarean section    . Carpal tunnel release     No family history on file. Social History  Substance Use Topics  . Smoking status: Current Every Day Smoker  . Smokeless tobacco: None  . Alcohol Use: Yes     Comment: occasinally   OB History    No data available     Review of Systems  Constitutional: Negative for fever and chills.  Respiratory: Negative for shortness of breath.   Cardiovascular: Negative for chest pain.  Gastrointestinal: Negative for nausea, vomiting, diarrhea and constipation.  Genitourinary: Negative for dysuria.  Psychiatric/Behavioral:  Negative for suicidal ideas. The patient is nervous/anxious.       Allergies  Codeine  Home Medications   Prior to Admission medications   Medication Sig Start Date End Date Taking? Authorizing Provider  benzonatate (TESSALON) 100 MG capsule Take 1 capsule (100 mg total) by mouth every 8 (eight) hours. 03/16/13   Renne CriglerJoshua Geiple, PA-C  chlorpheniramine-HYDROcodone (TUSSIONEX PENNKINETIC ER) 10-8 MG/5ML LQCR Take 5 mLs by mouth every 12 (twelve) hours as needed. 03/16/13   Renne CriglerJoshua Geiple, PA-C   Triage Vitals: BP 143/90 mmHg  Pulse 81  Temp(Src) 98.5 F (36.9 C) (Oral)  Resp 14  SpO2 99%  Physical Exam  Constitutional: She is oriented to person, place, and time. She appears well-developed and well-nourished. No distress.  HENT:  Head: Normocephalic and atraumatic.  Eyes: Conjunctivae and EOM are normal. Pupils are equal, round, and reactive to light.  Neck: Normal range of motion. Neck supple. No tracheal deviation present.  Cardiovascular: Normal rate and regular rhythm.  Exam reveals no gallop and no friction rub.   No murmur heard. Pulmonary/Chest: Effort normal and breath sounds normal. No respiratory distress. She has no wheezes. She has no rales. She exhibits no tenderness.  Abdominal: Soft. Bowel sounds are normal. She exhibits no distension and no mass. There is no tenderness. There is no rebound and no guarding.  Musculoskeletal: Normal range of motion. She exhibits no edema or tenderness.  Neurological: She is alert and oriented to person, place, and time.  Skin: Skin is warm and dry.  Psychiatric: She has a normal mood and affect. Her behavior is normal. Judgment and thought content normal.  Nursing note and vitals reviewed.   ED Course  Procedures (including critical care time)  DIAGNOSTIC STUDIES: Oxygen Saturation is 99% on RA, normal by my interpretation.    COORDINATION OF CARE: 8:34 PM- Will order  Ativan. Pt advised of plan for treatment and pt  agrees.     MDM   Final diagnoses:  Panic attack    Patient with panic attack. She has been off of her medications for approximately year. Will give a dose of Ativan here.  Patient feels significantly improved following Ativan. Will discharge her home with short course of the same, and recommend close follow-up with [facility].  I personally performed the services described in this documentation, which was scribed in my presence. The recorded information has been reviewed and is accurate.      Roxy Horseman, PA-C 08/02/15 2151  Rolland Porter, MD 08/14/15 [phone]

## 2015-08-19 ENCOUNTER — Encounter (HOSPITAL_COMMUNITY): Payer: Self-pay | Admitting: Emergency Medicine

## 2015-08-19 ENCOUNTER — Emergency Department (HOSPITAL_COMMUNITY)
Admission: EM | Admit: 2015-08-19 | Discharge: 2015-08-19 | Disposition: A | Discharge status: Home / Self Care | Payer: Self-pay | Attending: Emergency Medicine | Admitting: Emergency Medicine

## 2015-08-19 DIAGNOSIS — F419 Anxiety disorder, unspecified: Secondary | ICD-10-CM

## 2015-08-19 DIAGNOSIS — R064 Hyperventilation: Secondary | ICD-10-CM

## 2015-08-19 DIAGNOSIS — F410 Panic disorder [episodic paroxysmal anxiety] without agoraphobia: Secondary | ICD-10-CM | POA: Insufficient documentation

## 2015-08-19 DIAGNOSIS — F17200 Nicotine dependence, unspecified, uncomplicated: Secondary | ICD-10-CM | POA: Insufficient documentation

## 2015-08-19 MED ORDER — PAROXETINE HCL 10 MG PO TABS: 5.0000 mg | ORAL_TABLET | Freq: Every day | ORAL | Status: DC

## 2015-08-19 MED ORDER — LORAZEPAM 1 MG PO TABS: 1.0000 mg | ORAL_TABLET | Freq: Three times a day (TID) | ORAL | Status: DC | PRN

## 2015-08-19 NOTE — ED Provider Notes (Signed)
CSN: [id number]     Arrival date & time 08/19/15  0546 History   First MD Initiated Contact with Patient 08/19/15 [phone]     Chief Complaint  Patient presents with  . Panic Attack     (Consider location/radiation/quality/duration/timing/severity/associated sxs/prior Treatment) HPI Wendy Huerta is a(n) 49 y.o. female who presents to the ED with chief complaint of anxiety and panic attacks. The patient states that she has been having difficulty getting in to see her primary care physician. She has an established appointment in January. The patient was unable to refill her medications or her primary care because she needed an appointment. She came in at the beginning of this month with the same complaint after being off of her Paxil for one month. The patient states that she is having frequent panic attacks, hyperventilation, worsening, social anxiety disorder. She states she is having difficulty leaving her house. She is hoping to refill her Paxil prescription. She states that she takes 5 mg daily. Patient denies fevers, chills, weight loss, difficulty swallowing, or other symptoms of hyperthyroidism. The patient's mother died one month ago and she feels that this has worsened her anxiety along with being off her medications.  Past Medical History  Diagnosis Date  . Panic attacks   . Anxiety    Past Surgical History  Procedure Laterality Date  . Endometrial ablasion    . Cesarean section    . Carpal tunnel release     No family history on file. Social History  Substance Use Topics  . Smoking status: Current Every Day Smoker  . Smokeless tobacco: None  . Alcohol Use: Yes     Comment: occasinally   OB History    No data available     Review of Systems  Ten systems reviewed and are negative for acute change, except as noted in the HPI.    Allergies  Codeine  Home Medications   Prior to Admission medications   Medication Sig Start Date End Date Taking? Authorizing Provider   benzonatate (TESSALON) 100 MG capsule Take 1 capsule (100 mg total) by mouth every 8 (eight) hours. Patient not taking: Reported on 08/19/2015 03/16/13   Renne Crigler, PA-C  chlorpheniramine-HYDROcodone [facility] ER) 10-8 MG/5ML LQCR Take 5 mLs by mouth every 12 (twelve) hours as needed. Patient not taking: Reported on 08/19/2015 03/16/13   Renne Crigler, PA-C  LORazepam (ATIVAN) 1 MG tablet Take 1 tablet (1 mg total) by mouth 3 (three) times daily as needed for anxiety. Patient not taking: Reported on 08/19/2015 08/02/15   Roxy Horseman, PA-C   BP 144/93 mmHg  Pulse 80  Temp(Src) 98.4 F (36.9 C) (Oral)  Resp 24  Ht  (1.549 m)  Wt 134 lb (60.782 kg)  BMI 25.33 kg/m2  SpO2 100% Physical Exam  Constitutional: She is oriented to person, place, and time. She appears well-developed and well-nourished. No distress.  HENT:  Head: Normocephalic and atraumatic.  Eyes: Conjunctivae are normal. No scleral icterus.  Neck: Normal range of motion.  Cardiovascular: Normal rate, regular rhythm and normal heart sounds.  Exam reveals no gallop and no friction rub.   No murmur heard. Pulmonary/Chest: Effort normal and breath sounds normal. No respiratory distress.  Intermittently hyperventilating  Abdominal: Soft. Bowel sounds are normal. She exhibits no distension and no mass. There is no tenderness. There is no guarding.  Neurological: She is alert and oriented to person, place, and time.  Skin: Skin is warm and dry. She is not  diaphoretic.  Psychiatric: Her speech is normal and behavior is normal. Judgment and thought content normal. Her mood appears anxious. Cognition and memory are normal.  Nursing note and vitals reviewed.   ED Course  Procedures (including critical care time) Labs Review Labs Reviewed - No data to display  Imaging Review No results found. I have personally reviewed and evaluated these images and lab results as part of my medical decision-making.    EKG Interpretation None      MDM   Final diagnoses:  Anxiety  Panic attack  Hyperventilation    6:50 AM BP 128/87 mmHg  Pulse 78  Temp(Src) 98.4 F (36.9 C) (Oral)  Resp 24  Ht 5\' 1"  (1.549 m)  Wt 134 lb (60.782 kg)  BMI 25.33 kg/m2  SpO2 100% Patient  With SAD and Panic. Will refill her paxil. She has an established follow-up plan with her physician. She has long-standing history of anxiety and panic. I suggested that the patient also have her thyroid checked to be sure to rule out hyperthyroidism. She is well-appearing. She is safe for discharge at this time with her normal dose of praroxetine The patient appears reasonably screened and/or stabilized for discharge and I doubt any other medical condition or other [facility] requiring further screening, evaluation, or treatment in the ED at this time prior to discharge.     Arthor CaptainAbigail Treyvonne Tata, PA-C 08/19/15 [phone]  Tomasita CrumbleAdeleke Oni, MD 08/19/15 1620

## 2015-08-19 NOTE — Discharge Instructions (Signed)
Panic Attacks Panic attacks are sudden, short-livedsurges of severe anxiety, fear, or discomfort. They may occur for no reason when you are relaxed, when you are anxious, or when you are sleeping. Panic attacks may occur for a number of reasons:  1. Healthy people occasionally have panic attacks in extreme, life-threatening situations, such as war or natural disasters. Normal anxiety is a protective mechanism of the body that helps us react to danger (fight or flight response). 2. Panic attacks are often seen with anxiety disorders, such as panic disorder, social anxiety disorder, generalized anxiety disorder, and phobias. Anxiety disorders cause excessive or uncontrollable anxiety. They may interfere with your relationships or other life activities. 3. Panic attacks are sometimes seen with other mental illnesses, such as depression and posttraumatic stress disorder. 4. Certain medical conditions, prescription medicines, and drugs of abuse can cause panic attacks. SYMPTOMS  Panic attacks start suddenly, peak within 20 minutes, and are accompanied by four or more of the following symptoms:  Pounding heart or fast heart rate (palpitations).  Sweating.  Trembling or shaking.  Shortness of breath or feeling smothered.  Feeling choked.  Chest pain or discomfort.  Nausea or strange feeling in your stomach.  Dizziness, light-headedness, or feeling like you will faint.  Chills or hot flushes.  Numbness or tingling in your lips or hands and feet.  Feeling that things are not real or feeling that you are not yourself.  Fear of losing control or going crazy.  Fear of dying. Some of these symptoms can mimic serious medical conditions. For example, you may think you are having a heart attack. Although panic attacks can be very scary, they are not life threatening. DIAGNOSIS  Panic attacks are diagnosed through an assessment by your health care provider. Your health care provider will ask  questions about your symptoms, such as where and when they occurred. Your health care provider will also ask about your medical history and use of alcohol and drugs, including prescription medicines. Your health care provider may order blood tests or other studies to rule out a serious medical condition. Your health care provider may refer you to a mental health professional for further evaluation. TREATMENT   Most healthy people who have one or two panic attacks in an extreme, life-threatening situation will not require treatment.  The treatment for panic attacks associated with anxiety disorders or other mental illness typically involves counseling with a mental health professional, medicine, or a combination of both. Your health care provider will help determine what treatment is best for you.  Panic attacks due to physical illness usually go away with treatment of the illness. If prescription medicine is causing panic attacks, talk with your health care provider about stopping the medicine, decreasing the dose, or substituting another medicine.  Panic attacks due to alcohol or drug abuse go away with abstinence. Some adults need professional help in order to stop drinking or using drugs. HOME CARE INSTRUCTIONS   Take all medicines as directed by your health care provider.   Schedule and attend follow-up visits as directed by your health care provider. It is important to keep all your appointments. SEEK MEDICAL CARE IF:  You are not able to take your medicines as prescribed.  Your symptoms do not improve or get worse. SEEK IMMEDIATE MEDICAL CARE IF:   You experience panic attack symptoms that are different than your usual symptoms.  You have serious thoughts about hurting yourself or others.  You are taking medicine for panic attacks and  have a serious side effect. MAKE SURE YOU:  Understand these instructions.  Will watch your condition.  Will get help right away if you are not  doing well or get worse.   This information is not intended to replace advice given to you by your health care provider. Make sure you discuss any questions you have with your health care provider.   Document Released: 09/14/2005 Document Revised: 09/19/2013 Document Reviewed: 04/28/2013 Elsevier Interactive Patient Education 2016 [employer]  Panic Attacks Panic attacks are sudden, short feelings of great fear or discomfort. You may have them for no reason when you are relaxed, when you are uneasy (anxious), or when you are sleeping.  HOME CARE 5. Take all your medicines as told. 6. Check with your doctor before starting new medicines. 7. Keep all doctor visits. GET HELP IF:  You are not able to take your medicines as told.  Your symptoms do not get better.  Your symptoms get worse. GET HELP RIGHT AWAY IF:  Your attacks seem different than your normal attacks.  You have thoughts about hurting yourself or others.  You take panic attack medicine and you have a side effect. MAKE SURE YOU:  Understand these instructions.  Will watch your condition.  Will get help right away if you are not doing well or get worse.   This information is not intended to replace advice given to you by your health care provider. Make sure you discuss any questions you have with your health care provider.   Document Released: 10/17/2010 Document Revised: 07/05/2013 Document Reviewed: 04/28/2013 Elsevier Interactive Patient Education [employer]

## 2015-08-19 NOTE — ED Notes (Signed)
Seen here on 11/4 for panic attacks.  Sent home with script for Ativan 1mg  po tid prn.  Was only given 10 pills.  Also given a referral for a psychiatris.  Per patient needs a referral for a PCP.  Also reports being off paxil since October of last year due to no insurance.

## 2018-11-14 ENCOUNTER — Encounter (HOSPITAL_COMMUNITY): Payer: Self-pay

## 2018-11-14 ENCOUNTER — Emergency Department (HOSPITAL_COMMUNITY)
Admission: EM | Admit: 2018-11-14 | Discharge: 2018-11-14 | Disposition: A | Discharge status: Home / Self Care | Payer: BLUE CROSS/BLUE SHIELD | Attending: Emergency Medicine | Admitting: Emergency Medicine

## 2018-11-14 ENCOUNTER — Other Ambulatory Visit: Payer: Self-pay

## 2018-11-14 ENCOUNTER — Emergency Department (HOSPITAL_COMMUNITY): Payer: BLUE CROSS/BLUE SHIELD

## 2018-11-14 DIAGNOSIS — R42 Dizziness and giddiness: Secondary | ICD-10-CM | POA: Diagnosis not present

## 2018-11-14 DIAGNOSIS — R102 Pelvic and perineal pain: Secondary | ICD-10-CM | POA: Diagnosis not present

## 2018-11-14 DIAGNOSIS — F1290 Cannabis use, unspecified, uncomplicated: Secondary | ICD-10-CM | POA: Insufficient documentation

## 2018-11-14 DIAGNOSIS — Z79899 Other long term (current) drug therapy: Secondary | ICD-10-CM | POA: Diagnosis not present

## 2018-11-14 DIAGNOSIS — R05 Cough: Secondary | ICD-10-CM | POA: Insufficient documentation

## 2018-11-14 DIAGNOSIS — R110 Nausea: Secondary | ICD-10-CM | POA: Diagnosis not present

## 2018-11-14 DIAGNOSIS — R1030 Lower abdominal pain, unspecified: Secondary | ICD-10-CM | POA: Diagnosis present

## 2018-11-14 DIAGNOSIS — F17210 Nicotine dependence, cigarettes, uncomplicated: Secondary | ICD-10-CM | POA: Diagnosis not present

## 2018-11-14 LAB — CBC WITH DIFFERENTIAL/PLATELET
ABS IMMATURE GRANULOCYTES: 0.02 10*3/uL (ref 0.00–0.07)
BASOS PCT: 0 %
Basophils Absolute: 0 10*3/uL (ref 0.0–0.1)
Eosinophils Absolute: 0.1 10*3/uL (ref 0.0–0.5)
Eosinophils Relative: 2 %
HEMATOCRIT: 41.5 % (ref 36.0–46.0)
Hemoglobin: 12.6 g/dL (ref 12.0–15.0)
IMMATURE GRANULOCYTES: 1 %
LYMPHS PCT: 53 %
Lymphs Abs: 2.3 10*3/uL (ref 0.7–4.0)
MCH: 26.7 pg (ref 26.0–34.0)
MCHC: 30.4 g/dL (ref 30.0–36.0)
MCV: 87.9 fL (ref 80.0–100.0)
MONO ABS: 0.4 10*3/uL (ref 0.1–1.0)
MONOS PCT: 10 %
NEUTROS ABS: 1.5 10*3/uL — AB (ref 1.7–7.7)
Neutrophils Relative %: 34 %
Platelets: 165 10*3/uL (ref 150–400)
RBC: 4.72 MIL/uL (ref 3.87–5.11)
RDW: 13.1 % (ref 11.5–15.5)
Smear Review: ADEQUATE
WBC: 4.4 10*3/uL (ref 4.0–10.5)
nRBC: 0 % (ref 0.0–0.2)

## 2018-11-14 LAB — COMPREHENSIVE METABOLIC PANEL
ALT: 17 U/L (ref 0–44)
AST: 22 U/L (ref 15–41)
Albumin: 3.9 g/dL (ref 3.5–5.0)
Alkaline Phosphatase: 50 U/L (ref 38–126)
Anion gap: 6 (ref 5–15)
BILIRUBIN TOTAL: 0.6 mg/dL (ref 0.3–1.2)
BUN: 10 mg/dL (ref 6–20)
CALCIUM: 8.6 mg/dL — AB (ref 8.9–10.3)
CO2: 22 mmol/L (ref 22–32)
Chloride: 110 mmol/L (ref 98–111)
Creatinine, Ser: 0.88 mg/dL (ref 0.44–1.00)
GFR calc Af Amer: >60 mL/min (ref >60)
Glucose, Bld: 97 mg/dL (ref 70–99)
Potassium: 3.8 mmol/L (ref 3.5–5.1)
Sodium: 138 mmol/L (ref 135–145)
TOTAL PROTEIN: 6.7 g/dL (ref 6.5–8.1)

## 2018-11-14 LAB — URINALYSIS, ROUTINE W REFLEX MICROSCOPIC
Bilirubin Urine: NEGATIVE
GLUCOSE, UA: NEGATIVE mg/dL
HGB URINE DIPSTICK: NEGATIVE
Ketones, ur: NEGATIVE mg/dL
Leukocytes,Ua: NEGATIVE
Nitrite: NEGATIVE
Protein, ur: NEGATIVE mg/dL
Specific Gravity, Urine: 1.016 (ref 1.005–1.030)
pH: 6 (ref 5.0–8.0)

## 2018-11-14 LAB — I-STAT BETA HCG BLOOD, ED (MC, WL, AP ONLY): I-stat hCG, quantitative: <5 m[IU]/mL (ref <5)

## 2018-11-14 LAB — LACTIC ACID, PLASMA: LACTIC ACID, VENOUS: 0.9 mmol/L (ref 0.5–1.9)

## 2018-11-14 MED ORDER — SODIUM CHLORIDE 0.9 % IV BOLUS
1000.0000 mL | Freq: Once | INTRAVENOUS | Status: AC
  Administered 2018-11-14: 1000 mL via INTRAVENOUS

## 2018-11-14 MED ORDER — BENZONATATE 100 MG PO CAPS: 100.0000 mg | ORAL_CAPSULE | Freq: Three times a day (TID) | ORAL | 0 refills | Status: DC

## 2018-11-14 MED ORDER — ONDANSETRON 4 MG PO TBDP: 4.0000 mg | ORAL_TABLET | Freq: Three times a day (TID) | ORAL | 0 refills | Status: DC | PRN

## 2018-11-14 MED ORDER — MORPHINE SULFATE (PF) 4 MG/ML IV SOLN
4.0000 mg | Freq: Once | INTRAVENOUS | Status: AC
  Administered 2018-11-14: 4 mg via INTRAVENOUS
  Filled 2018-11-14: qty 1

## 2018-11-14 MED ORDER — METOCLOPRAMIDE HCL 5 MG/ML IJ SOLN
10.0000 mg | Freq: Once | INTRAMUSCULAR | Status: AC
  Administered 2018-11-14: 10 mg via INTRAVENOUS
  Filled 2018-11-14: qty 2

## 2018-11-14 MED ORDER — ONDANSETRON HCL 4 MG/2ML IJ SOLN
4.0000 mg | Freq: Once | INTRAMUSCULAR | Status: AC
  Administered 2018-11-14: 4 mg via INTRAVENOUS
  Filled 2018-11-14: qty 2

## 2018-11-14 MED ORDER — IOHEXOL 300 MG/ML  SOLN
100.0000 mL | Freq: Once | INTRAMUSCULAR | Status: AC | PRN
  Administered 2018-11-14: 100 mL via INTRAVENOUS

## 2018-11-14 NOTE — ED Notes (Signed)
Patient transported to CT scan . 

## 2018-11-14 NOTE — ED Notes (Signed)
Delay in lab draw,  Pt not in room 

## 2018-11-14 NOTE — ED Provider Notes (Signed)
[facility] EMERGENCY DEPARTMENT Provider Note   CSN: [id number] Arrival date & time: 11/14/18  0108     History   Chief Complaint Chief Complaint  Patient presents with  . Abdominal Pain    HPI Wendy Huerta is a 53 y.o. female with history of cervical dysplasia, depression, anxiety, and panic attacks who presents to the emergency department with a chief complaint of abdominal pain.  The patient endorses bilateral lower abdominal pain for the last week.  She is unable to characterize the pain. Pain is constant and nonradiating.  No known aggravating or alleviating factors.  No history of similar pain. She reports that she had 1 day of fever a week ago, which has since resolved.  She reports she has been having a dry cough and mild shortness of breath for the last week.  She vapes tobacco daily.  She has been eating and drinking less since the onset of her symptoms.  She reports she has developed intermittent lightheadedness with standing over the last few days.  She reports she has voided 2-3 times daily.  She denies hematuria, dysuria, vaginal pain, bleeding, discharge, back pain, vomiting, constipation, diarrhea, or chest pain.  No straining with bowel movements, hematochezia, or melena.  She treated her symptoms at home yesterday with Alka-Seltzer without improvement.  She reports she took 1 dose of Aleve 2 days ago without improvement in her symptoms.  LMP was at the beginning of February.  She reports a history of heavy menstrual cycles.  She is currently sexually active with one female partner.  She is not currently established with an OB/GYN.  The history is provided by the patient. No language interpreter was used.    Past Medical History:  Diagnosis Date  . Anxiety   . Panic attacks     Patient Active Problem List   Diagnosis Date Noted  . DISORDER, DEPRESSIVE NEC 02/14/2007  . SYMPTOM, ABNORMAL LOSS OF WEIGHT 02/14/2007  . DYSPLASIA, CERVIX NOS  01/18/2007  . VAGINAL DISCHARGE 01/18/2007  . IRREGULAR MENSTRUATION 01/18/2007  . ANXIETY 11/25/2006  . TOBACCO DEPENDENCE 11/25/2006  . CERVICAL DYSPLASIA 11/25/2006  . VERTIGO NOS OR DIZZINESS 11/25/2006    Past Surgical History:  Procedure Laterality Date  . CARPAL TUNNEL RELEASE    . CESAREAN SECTION    . endometrial ablasion       OB History   No obstetric history on file.      Home Medications    Prior to Admission medications   Medication Sig Start Date End Date Taking? Authorizing Provider  benzonatate (TESSALON) 100 MG capsule Take 1 capsule (100 mg total) by mouth every 8 (eight) hours. 11/14/18   Chioma Mukherjee A, PA-C  chlorpheniramine-HYDROcodone (TUSSIONEX PENNKINETIC ER) 10-8 MG/5ML LQCR Take 5 mLs by mouth every 12 (twelve) hours as needed. Patient not taking: Reported on 08/19/2015 03/16/13   Renne Crigler, PA-C  LORazepam (ATIVAN) 1 MG tablet Take 1 tablet (1 mg total) by mouth 3 (three) times daily as needed for anxiety. 08/19/15   Harris, Abigail, PA-C  ondansetron (ZOFRAN ODT) 4 MG disintegrating tablet Take 1 tablet (4 mg total) by mouth every 8 (eight) hours as needed for nausea or vomiting. 11/14/18   Masiah Lewing A, PA-C  PARoxetine (PAXIL) 10 MG tablet Take 0.5 tablets (5 mg total) by mouth daily. 08/19/15   Arthor Captain, PA-C    Family History History reviewed. No pertinent family history.  Social History Social History   Tobacco Use  .  Smoking status: Current Every Day Smoker  Substance Use Topics  . Alcohol use: Yes    Comment: occasinally  . Drug use: Yes    Types: Marijuana     Allergies   Codeine   Review of Systems Review of Systems  Constitutional: Positive for fever (resolved). Negative for activity change.  HENT: Negative for congestion, sinus pressure, sinus pain and sore throat.   Eyes: Negative for visual disturbance.  Respiratory: Positive for cough. Negative for shortness of breath.   Cardiovascular: Negative for  chest pain, palpitations and leg swelling.  Gastrointestinal: Positive for abdominal pain and nausea. Negative for diarrhea and vomiting.  Genitourinary: Negative for dysuria, flank pain, hematuria, urgency, vaginal bleeding, vaginal discharge and vaginal pain.  Musculoskeletal: Negative for back pain and neck pain.  Skin: Negative for rash.  Allergic/Immunologic: Negative for immunocompromised state.  Neurological: Positive for light-headedness. Negative for weakness and headaches.  Psychiatric/Behavioral: Negative for confusion.     Physical Exam Updated Vital Signs BP 114/74 (BP Location: Left Arm)   Pulse (!) 59   Temp 98.5 F (36.9 C) (Oral)   Resp 16   Ht 5\' 1"  (1.549 m)   Wt 63.5 kg   SpO2 98%   BMI 26.45 kg/m   Physical Exam Vitals signs and nursing note reviewed.  Constitutional:      General: She is not in acute distress. HENT:     Head: Normocephalic.  Eyes:     Conjunctiva/sclera: Conjunctivae normal.  Neck:     Musculoskeletal: Neck supple.  Cardiovascular:     Rate and Rhythm: Normal rate and regular rhythm.     Heart sounds: No murmur. No friction rub. No gallop.   Pulmonary:     Effort: Pulmonary effort is normal. No respiratory distress.     Breath sounds: No stridor. No wheezing, rhonchi or rales.  Chest:     Chest wall: No tenderness.  Abdominal:     General: Abdomen is flat. Bowel sounds are increased. There is no distension.     Palpations: Abdomen is soft.     Tenderness: There is abdominal tenderness in the right lower quadrant and left lower quadrant. There is no right CVA tenderness, left CVA tenderness, guarding or rebound. Negative signs include Murphy's sign and McBurney's sign.     Hernia: No hernia is present.     Comments: Tender to palpation in the bilateral lower abdomen and pelvic region.  No focal suprapubic tenderness.  Abdomen is soft, nondistended.  Bowel sounds are hyperactive.  No tenderness over McBurney's point.  No CVA  tenderness bilaterally.  No rebound or guarding.  Skin:    General: Skin is warm.     Capillary Refill: Capillary refill takes less than 2 seconds.     Findings: No rash.  Neurological:     Mental Status: She is alert.  Psychiatric:        Behavior: Behavior normal.      ED Treatments / Results  Labs (all labs ordered are listed, but only abnormal results are displayed) Labs Reviewed  URINALYSIS, ROUTINE W REFLEX MICROSCOPIC - Abnormal; Notable for the following components:      Result Value   APPearance HAZY (*)    All other components within normal limits  CBC WITH DIFFERENTIAL/PLATELET - Abnormal; Notable for the following components:   Neutro Abs 1.5 (*)    All other components within normal limits  COMPREHENSIVE METABOLIC PANEL - Abnormal; Notable for the following components:   Calcium 8.6 (*)  All other components within normal limits  LACTIC ACID, PLASMA  LACTIC ACID, PLASMA  I-STAT BETA HCG BLOOD, ED ([facility], [facility], [facility] ONLY)    EKG None  Radiology Dg Chest 2 View  Result Date: 11/14/2018 CLINICAL DATA:  Initial evaluation for acute cough, shortness of breath. EXAM: CHEST - 2 VIEW COMPARISON:  None. FINDINGS: The cardiac and mediastinal silhouettes are within normal limits. The lungs are normally inflated. No airspace consolidation, pleural effusion, or pulmonary edema is identified. There is no pneumothorax. No acute osseous abnormality identified. IMPRESSION: No active cardiopulmonary disease. Electronically Signed   By: Rise Mu M.D.   On: 11/14/2018 02:25   Ct Abdomen Pelvis W Contrast  Result Date: 11/14/2018 CLINICAL DATA:  Abdominal pain with nausea for 2 weeks EXAM: CT ABDOMEN AND PELVIS WITH CONTRAST TECHNIQUE: Multidetector CT imaging of the abdomen and pelvis was performed using the standard protocol following bolus administration of intravenous contrast. CONTRAST:  OMNIPAQUE IOHEXOL 300 MG/ML  SOLN COMPARISON:  None. FINDINGS: Lower chest:   No contributory findings. Hepatobiliary: No focal liver abnormality.Cholelithiasis. No bile duct dilatation. Pancreas: Unremarkable. Spleen: Unremarkable. Adrenals/Urinary Tract: Negative adrenals. No hydronephrosis or stone. Unremarkable bladder. Stomach/Bowel: No obstruction. No appendicitis. Left colonic diverticulosis. Vascular/Lymphatic: No acute vascular abnormality. No mass or adenopathy. Reproductive: Somewhat globular appearance of the uterus, possible adenomyosis. Presumed corpus luteum on the right. Other: No ascites or pneumoperitoneum. Musculoskeletal: No acute abnormalities. IMPRESSION: 1. No acute finding. 2. Cholelithiasis and colonic diverticulosis. Electronically Signed   By: Marnee Spring M.D.   On: 11/14/2018 04:27    Procedures Procedures (including critical care time)  Medications Ordered in ED Medications  sodium chloride 0.9 % bolus 1,000 mL (0 mLs Intravenous Stopped 11/14/18 0334)  ondansetron (ZOFRAN) injection 4 mg (4 mg Intravenous Given 11/14/18 0222)  morphine 4 MG/ML injection 4 mg (4 mg Intravenous Given 11/14/18 0347)  iohexol (OMNIPAQUE) 300 MG/ML solution 100 mL (100 mLs Intravenous Contrast Given 11/14/18 0404)  metoCLOPramide (REGLAN) injection 10 mg (10 mg Intravenous Given 11/14/18 0541)     Initial Impression / Assessment and Plan / ED Course  I have reviewed the triage vital signs and the nursing notes.  Pertinent labs & imaging results that were available during my care of the patient were reviewed by me and considered in my medical decision making (see chart for details).  53 year old female with history of anxiety, panic attacks, depression, and cervical dysplasia presenting with bilateral lower abdominal pain, nausea, and intermittent lightheadedness, and decreased appetite over the last week.  On exam, she is tender to palpation in the bilateral lower abdomen.  No rebound or guarding.  She does not have a surgical abdomen.  Will order labs and also  check a chest x-ray since she has been having cough and mild shortness of breath and is a current everyday smoker.  Chest x-ray is unremarkable.  Labs are reassuring.  Since she has not been taking any medication at home for pain, she was given Zofran and Tylenol in the ER.   Clinical Course as of Nov 14 738  Mon Nov 14, 2018  4268 Patient recheck.  Nausea has resolved, but her abdominal pain persists.  On reexamination, she continues to be tender to palpation in the bilateral lower abdomen.  Will order CT abdomen pelvis and morphine for pain control.   [MM]    Clinical Course User Index [MM] Sacheen Arrasmith A, PA-C    CT abdomen pelvis with cholelithiasis and colonic diverticulosis, but no  acute findings.  There is noted to be a globular appearance of the uterus, possibly adenomyosis and presumed corpus luteum on the right ovary.  Orthostatics are negative.  On reevaluation, she reports pain is improved with morphine.  At this time, I am uncertain of the etiology of her symptoms.Discussed work-up, and I do not feel that a pelvic ultrasound is indicated on an emergent basis at this time.  Will refer her to OB/GYN and if pain persist she can be that reevaluated in the clinic.  We will plan to discharge with Zofran for nausea.  Low suspicion for TOA, ectopic pregnancy, ovarian torsion, diverticulitis, appendicitis, cholecystitis, pericarditis.  The patient was ambulated by me to the restroom prior to discharge.  She is minimally drowsy from the morphine, but she is accompanied by her significant other and feels that she is ready for discharge at this time.  Does not appear to be a fall risk at this time.  Strict return precautions given.  She is hemodynamically stable and in no acute distress.  She is safe for discharge to home with outpatient follow-up at this time.  Final Clinical Impressions(s) / ED Diagnoses   Final diagnoses:  Lower abdominal pain  Nausea    ED Discharge Orders          Ordered    benzonatate (TESSALON) 100 MG capsule  Every 8 hours     11/14/18 0531    ondansetron (ZOFRAN ODT) 4 MG disintegrating tablet  Every 8 hours PRN     11/14/18 0531           Lilyannah Zuelke A, PA-C 11/14/18 0740    Geoffery Lyons, MD 11/16/18 [phone]

## 2018-11-14 NOTE — Discharge Instructions (Signed)
Thank you for allowing me to care for you today in the Emergency Department.   You have been seen in the Emergency Department (ED) for abdominal pain.  Your evaluation did not identify a clear cause of your symptoms but was generally reassuring.  You can take 600 mg of ibuprofen with food or 650 mg of Tylenol once every 6 hours for pain or alternate between these 2 medications every 3 hours.  Let 1 tablet of Zofran dissolve in your tongue every 8 hours as needed for nausea or vomiting.  Continue to drink plenty of fluids to avoid dehydration.  You can take 1 tablet of benzonatate every 8 hours as needed for cough.  Please follow up as instructed above regarding todays emergent visit and the symptoms that are bothering you.  Return to the ED if your abdominal pain worsens or fails to improve, you develop bloody vomiting, bloody diarrhea, you are unable to tolerate fluids due to vomiting, fever greater than 101, or other symptoms that concern you.

## 2018-11-14 NOTE — ED Triage Notes (Signed)
Pt with c/o abd pain for approx 1 week. Pt states thaty she had flu-like symptoms last Monday. Pt states she attempted OTC meds but nothing has relieved abd pain.

## 2019-12-22 ENCOUNTER — Ambulatory Visit: Payer: Self-pay | Attending: Internal Medicine

## 2019-12-22 DIAGNOSIS — Z23 Encounter for immunization: Secondary | ICD-10-CM

## 2019-12-22 NOTE — Progress Notes (Signed)
   Covid-19 Vaccination Clinic  Name:  Wendy Huerta    MRN: [id number] DOB: [birth year]  12/22/2019  [name] was observed post Covid-19 immunization for 15 minutes without incident. She was provided with Vaccine Information Sheet and instruction to access the V-Safe system.   [name] was instructed to call 911 with any severe reactions post vaccine: Marland Kitchen Difficulty breathing  . Swelling of face and throat  . A fast heartbeat  . A bad rash all over body  . Dizziness and weakness   Immunizations Administered    Name Date Dose VIS Date Route   Moderna COVID-19 Vaccine 12/22/2019  2:25 PM 0.5 mL 08/29/2019 Intramuscular   Manufacturer: Moderna   Lot: [id number]   NDC: 59563-875-64

## 2020-01-19 ENCOUNTER — Ambulatory Visit: Payer: Self-pay

## 2020-06-24 ENCOUNTER — Ambulatory Visit (HOSPITAL_COMMUNITY)
Admission: EM | Admit: 2020-06-24 | Discharge: 2020-06-24 | Disposition: A | Discharge status: Home / Self Care | Payer: BC Managed Care – PPO | Attending: Family Medicine | Admitting: Family Medicine

## 2020-06-24 ENCOUNTER — Other Ambulatory Visit: Payer: Self-pay

## 2020-06-24 ENCOUNTER — Encounter (HOSPITAL_COMMUNITY): Payer: Self-pay | Admitting: *Deleted

## 2020-06-24 DIAGNOSIS — N390 Urinary tract infection, site not specified: Secondary | ICD-10-CM | POA: Diagnosis not present

## 2020-06-24 DIAGNOSIS — F17200 Nicotine dependence, unspecified, uncomplicated: Secondary | ICD-10-CM | POA: Insufficient documentation

## 2020-06-24 DIAGNOSIS — N898 Other specified noninflammatory disorders of vagina: Secondary | ICD-10-CM | POA: Diagnosis not present

## 2020-06-24 DIAGNOSIS — Z79899 Other long term (current) drug therapy: Secondary | ICD-10-CM | POA: Diagnosis not present

## 2020-06-24 DIAGNOSIS — Z885 Allergy status to narcotic agent status: Secondary | ICD-10-CM | POA: Insufficient documentation

## 2020-06-24 LAB — POCT URINALYSIS DIPSTICK, ED / UC
Bilirubin Urine: NEGATIVE
Glucose, UA: NEGATIVE mg/dL
Nitrite: POSITIVE — AB
Protein, ur: NEGATIVE mg/dL
Specific Gravity, Urine: 1.025 (ref 1.005–1.030)
Urobilinogen, UA: 0.2 mg/dL (ref 0.0–1.0)
pH: 7 (ref 5.0–8.0)

## 2020-06-24 MED ORDER — NITROFURANTOIN MONOHYD MACRO 100 MG PO CAPS: 100.0000 mg | ORAL_CAPSULE | Freq: Two times a day (BID) | ORAL | 0 refills | Status: AC

## 2020-06-24 MED ORDER — FLUCONAZOLE 150 MG PO TABS: ORAL_TABLET | ORAL | 0 refills | Status: DC

## 2020-06-24 NOTE — ED Provider Notes (Signed)
[facility]    CSN: [id number] Arrival date & time: 06/24/20  0737      History   Chief Complaint No chief complaint on file.   HPI Wendy Huerta is a 54 y.o. female.   Ricca [place] presents with complaints of burning to vulva for the past 3 weeks. Used over the [employer] which caused more burning. Still with itching externally. No vaginal discharge. Took a long bath prior to onset of symptoms, which she feels may have contributed. Has had yeast infections in the past. Was painful when she urinates, this has improved. No urinary frequency or urgency. She also has a small bump towards the rectum which is painful. Sexually active with husband. Denies any STD concerns. Has had an ablation. No pelvic pain.    ROS per HPI, negative if not otherwise mentioned.      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      Home Medications    Prior to Admission medications   Medication Sig Start Date End Date Taking? Authorizing Provider  benzonatate (TESSALON) 100 MG capsule Take 1 capsule (100 mg total) by mouth every 8 (eight) hours. 11/14/18   McDonald, Mia A, PA-C  chlorpheniramine-HYDROcodone (TUSSIONEX PENNKINETIC ER) 10-8 MG/5ML LQCR Take 5 mLs by mouth every 12 (twelve) hours as needed. Patient not taking: Reported on 08/19/2015 03/16/13   Renne Crigler, PA-C  fluconazole  (DIFLUCAN) 150 MG tablet Take 1 tab today 06/24/20   Georgetta Haber, NP  LORazepam (ATIVAN) 1 MG tablet Take 1 tablet (1 mg total) by mouth 3 (three) times daily as needed for anxiety. 08/19/15   Arthor Captain, PA-C  nitrofurantoin, macrocrystal-monohydrate, (MACROBID) 100 MG capsule Take 1 capsule (100 mg total) by mouth 2 (two) times daily for 5 days. 06/24/20 06/29/20  Georgetta Haber, NP  ondansetron (ZOFRAN ODT) 4 MG disintegrating tablet Take 1 tablet (4 mg total) by mouth every 8 (eight) hours as needed for nausea or vomiting. 11/14/18   McDonald, Mia A, PA-C  PARoxetine (PAXIL) 10 MG tablet Take 0.5 tablets (5 mg total) by mouth daily. 08/19/15   Arthor Captain, PA-C    Family History History reviewed. No pertinent family history.  Social History Social History   Tobacco Use   Smoking status: Current Every Day Smoker   Smokeless tobacco: Never Used  Substance Use Topics   Alcohol use: Yes    Comment: occasinally   Drug use: Yes    Types: Marijuana     Allergies   Codeine   Review of Systems Review of Systems   Physical Exam Triage Vital Signs ED Triage Vitals  Enc Vitals Group     BP 06/24/20 0902 (!) 167/85     Pulse Rate 06/24/20 0902 79     Resp 06/24/20 0902 16     Temp  06/24/20 0902 98.7 F (37.1 C)     Temp Source 06/24/20 0902 Oral     SpO2 06/24/20 0902 100 %     Weight 06/24/20 0904 130 lb (59 kg)     Height 06/24/20 0904 5\' 1"  (1.549 m)     Head Circumference --      Peak Flow --      Pain Score 06/24/20 0903 5     Pain Loc --      Pain Edu? --      Excl. in GC? --    No data found.  Updated Vital Signs BP (!) 167/85 (BP Location: Left Arm)    Pulse 79    Temp 98.7 F (37.1 C) (Oral)    Resp 16    Ht 5\' 1"  (1.549 m)    Wt 130 lb (59 kg)    SpO2 100%    BMI 24.56 kg/m   Visual Acuity Right Eye Distance:   Left Eye Distance:   Bilateral Distance:    Right Eye Near:   Left Eye Near:    Bilateral Near:     Physical  Exam Constitutional:      General: She is not in acute distress.    Appearance: She is well-developed.  Cardiovascular:     Rate and Rhythm: Normal rate.  Pulmonary:     Effort: Pulmonary effort is normal.  Abdominal:     Tenderness: There is no abdominal tenderness.  Genitourinary:      Comments: Region of apparent follicullitis? Which is now open skin, likely from frequent cleansing/wiping which she endorses; no ulceration; no papule present; no visible discharge or rash to vulva  Skin:    General: Skin is warm and dry.  Neurological:     Mental Status: She is alert and oriented to person, place, and time.      UC Treatments / Results  Labs (all labs ordered are listed, but only abnormal results are displayed) Labs Reviewed  POCT URINALYSIS DIPSTICK, ED / UC - Abnormal; Notable for the following components:      Result Value   Ketones, ur TRACE (*)    Hgb urine dipstick TRACE (*)    Nitrite POSITIVE (*)    Leukocytes,Ua TRACE (*)    All other components within normal limits  URINE CULTURE  CERVICOVAGINAL ANCILLARY ONLY    EKG   Radiology No results found.  Procedures Procedures (including critical care time)  Medications Ordered in UC Medications - No data to display  Initial Impression / Assessment and Plan / UC Course  I have reviewed the triage vital signs and the nursing notes.  Pertinent labs & imaging results that were available during my care of the patient were reviewed by me and considered in my medical decision making (see chart for details).     Treatment for UTI provided with culture pending as well as vaginal cytology. Diflucan x1 provided for itching pending results. Supportive cares for the open area, limit aggressive cleansing. Return precautions provided. Patient verbalized understanding and agreeable to plan.   Final Clinical Impressions(s) / UC Diagnoses   Final diagnoses:  Urinary tract infection without hematuria, site unspecified   Vaginal itching     Discharge Instructions     Complete course of antibiotics.  Drink plenty of water to empty bladder regularly. Avoid alcohol and caffeine as these may irritate the bladder.   I have sent a pill to treat for yeast as well, pending results of your vaginal  swab. We will notify of you any positive findings or if any changes to treatment are needed. If normal or otherwise without concern to your results, we will not call you. Please log on to your MyChart to review your results if interested in so.   If symptoms worsen or do not improve in the next week to return to be seen or to follow up with your PCP.      ED Prescriptions    Medication Sig Dispense Auth. Provider   nitrofurantoin, macrocrystal-monohydrate, (MACROBID) 100 MG capsule Take 1 capsule (100 mg total) by mouth 2 (two) times daily for 5 days. 10 capsule Linus Mako B, NP   fluconazole (DIFLUCAN) 150 MG tablet Take 1 tab today 1 tablet Georgetta Haber, NP     PDMP not reviewed this encounter.   Georgetta Haber, NP 06/24/20 1055

## 2020-06-24 NOTE — ED Triage Notes (Signed)
Pt reports 3 weeks ago buring when voiding. Pt has been using OTC monistat with outrelief. Pt also reports a bump on labia.

## 2020-06-24 NOTE — Discharge Instructions (Signed)
Complete course of antibiotics.  Drink plenty of water to empty bladder regularly. Avoid alcohol and caffeine as these may irritate the bladder.   I have sent a pill to treat for yeast as well, pending results of your vaginal swab. We will notify of you any positive findings or if any changes to treatment are needed. If normal or otherwise without concern to your results, we will not call you. Please log on to your MyChart to review your results if interested in so.   If symptoms worsen or do not improve in the next week to return to be seen or to follow up with your PCP.

## 2020-06-25 LAB — CERVICOVAGINAL ANCILLARY ONLY
Bacterial Vaginitis (gardnerella): NEGATIVE
Candida Glabrata: NEGATIVE
Candida Vaginitis: NEGATIVE
Chlamydia: NEGATIVE
Comment: NEGATIVE
Comment: NEGATIVE
Comment: NEGATIVE
Comment: NEGATIVE
Comment: NEGATIVE
Comment: NORMAL
Neisseria Gonorrhea: NEGATIVE
Trichomonas: NEGATIVE

## 2020-06-26 LAB — URINE CULTURE: Culture: >=100000 — AB

## 2020-07-21 IMAGING — CR DG CHEST 2V
2 series · 2 of 2 positions shown · non-contrast
Comparison: None.

CLINICAL DATA: Initial evaluation for acute cough, shortness of
breath.

EXAM:
CHEST - 2 VIEW

[chest pa]
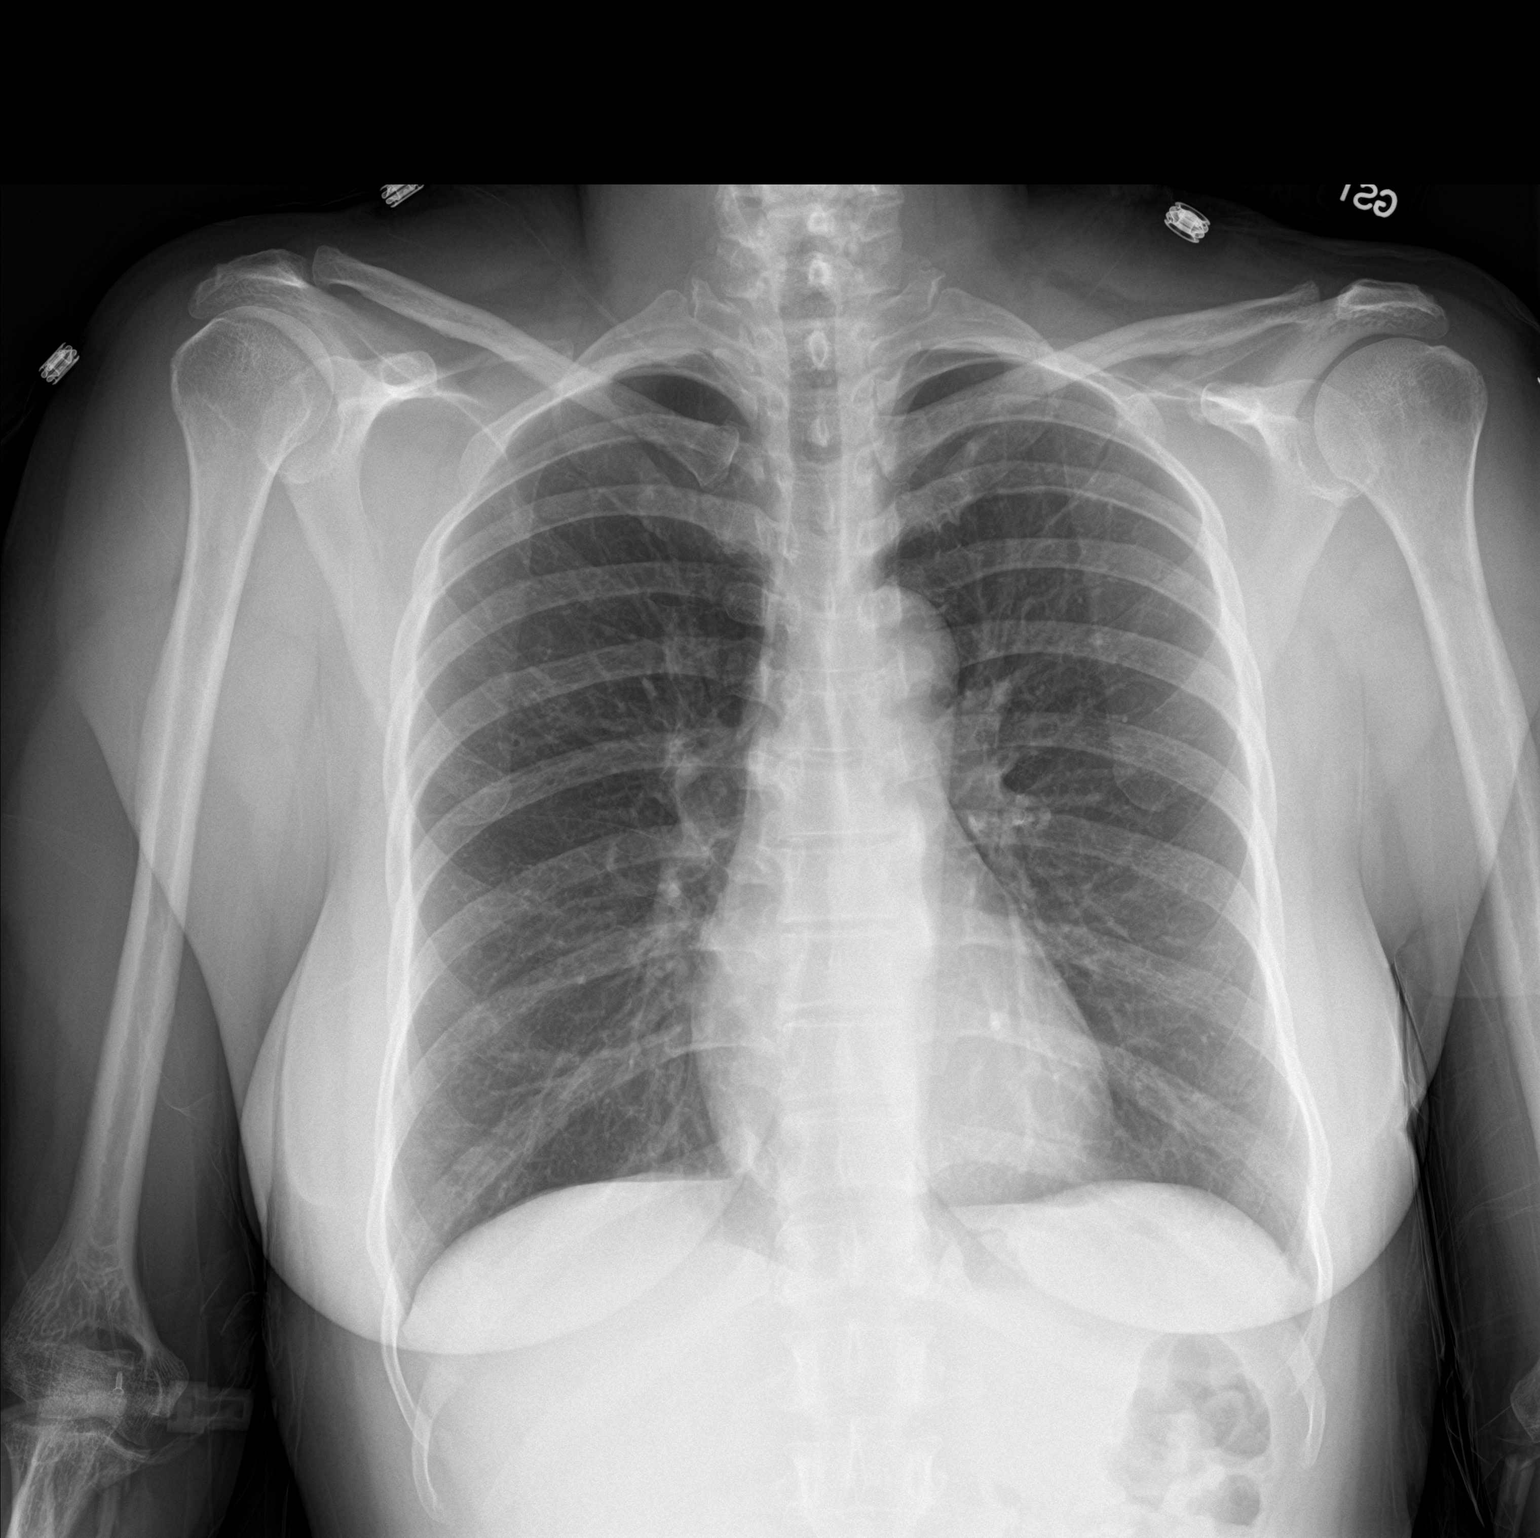

[chest lat]
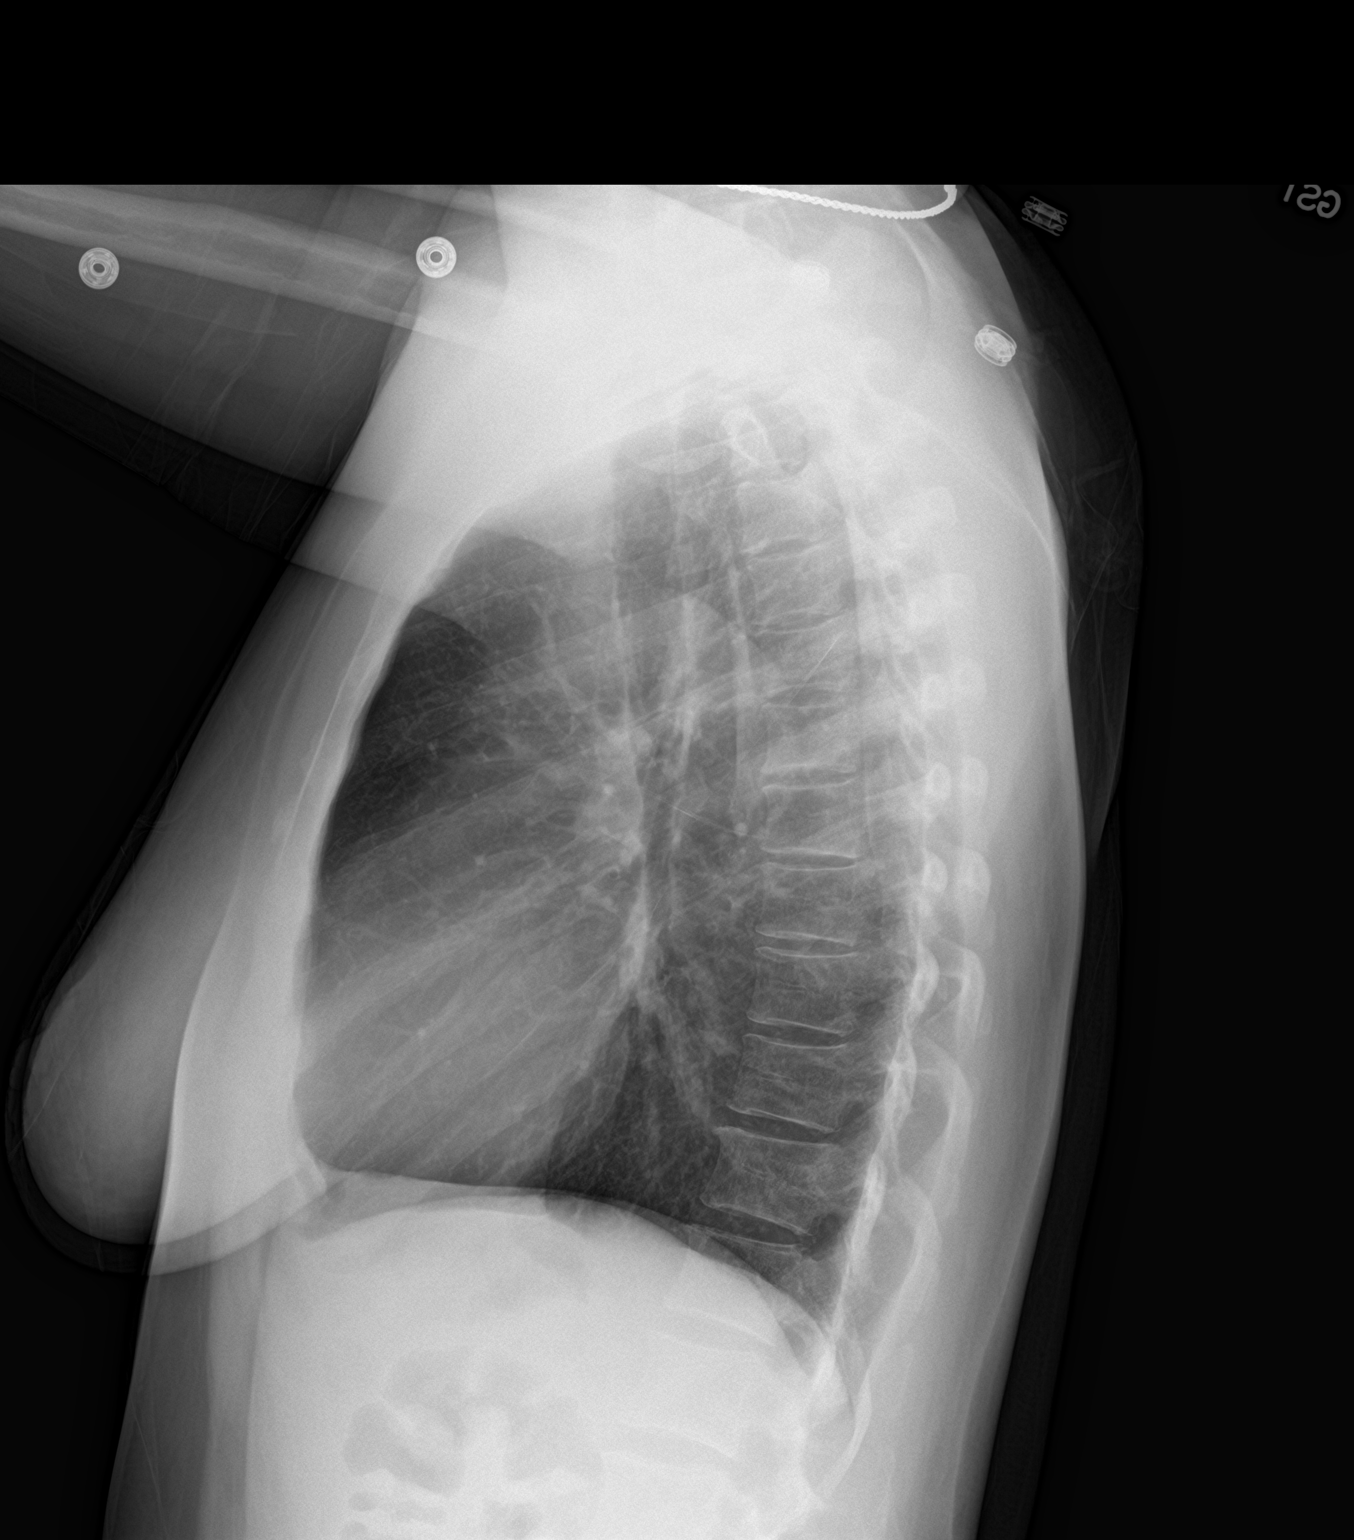

[2 of 2 positions shown; findings below may reference images not displayed]

FINDINGS: The cardiac and mediastinal silhouettes are within normal limits.

The lungs are normally inflated. No airspace consolidation, pleural
effusion, or pulmonary edema is identified. There is no
pneumothorax.

No acute osseous abnormality identified.
IMPRESSION: No active cardiopulmonary disease.

## 2021-06-07 DIAGNOSIS — M160 Bilateral primary osteoarthritis of hip: Secondary | ICD-10-CM | POA: Diagnosis not present

## 2021-06-07 DIAGNOSIS — M1711 Unilateral primary osteoarthritis, right knee: Secondary | ICD-10-CM | POA: Diagnosis not present

## 2021-06-07 DIAGNOSIS — M1611 Unilateral primary osteoarthritis, right hip: Secondary | ICD-10-CM | POA: Diagnosis not present

## 2021-06-07 DIAGNOSIS — M170 Bilateral primary osteoarthritis of knee: Secondary | ICD-10-CM | POA: Diagnosis not present

## 2022-12-06 DIAGNOSIS — Z20822 Contact with and (suspected) exposure to covid-19: Secondary | ICD-10-CM | POA: Diagnosis not present

## 2022-12-06 DIAGNOSIS — R519 Headache, unspecified: Secondary | ICD-10-CM | POA: Diagnosis not present

## 2022-12-17 DIAGNOSIS — H6992 Unspecified Eustachian tube disorder, left ear: Secondary | ICD-10-CM | POA: Diagnosis not present

## 2022-12-17 DIAGNOSIS — J019 Acute sinusitis, unspecified: Secondary | ICD-10-CM | POA: Diagnosis not present

## 2022-12-21 ENCOUNTER — Ambulatory Visit (INDEPENDENT_AMBULATORY_CARE_PROVIDER_SITE_OTHER): Payer: BC Managed Care – PPO | Admitting: Family Medicine

## 2022-12-21 ENCOUNTER — Encounter (HOSPITAL_BASED_OUTPATIENT_CLINIC_OR_DEPARTMENT_OTHER): Payer: Self-pay | Admitting: Family Medicine

## 2022-12-21 VITALS — BP 143/90 | HR 92 | Ht 61.0 in | Wt 194.0 lb

## 2022-12-21 DIAGNOSIS — Z6836 Body mass index (BMI) 36.0-36.9, adult: Secondary | ICD-10-CM

## 2022-12-21 DIAGNOSIS — Z7689 Persons encountering health services in other specified circumstances: Secondary | ICD-10-CM

## 2022-12-21 DIAGNOSIS — Z720 Tobacco use: Secondary | ICD-10-CM

## 2022-12-21 DIAGNOSIS — Z0000 Encounter for general adult medical examination without abnormal findings: Secondary | ICD-10-CM

## 2022-12-21 DIAGNOSIS — R030 Elevated blood-pressure reading, without diagnosis of hypertension: Secondary | ICD-10-CM | POA: Diagnosis not present

## 2022-12-21 NOTE — Progress Notes (Signed)
Established Patient Office Visit  Subjective   Patient ID: Wendy Huerta, female    DOB: December 24, 1965  Age: 57 y.o. MRN: [id number]  Wendy Huerta is a 57 yo female patient that presents to establish care. She is mostly concerned about her blood pressure and would like to focus on weight loss.  Patient reports she has been working out for the past 6 months.  She states she works out at least 4 times per week and does 30-minute cardio sessions.  She has a smart watch and states that her highest blood pressure HR will increase to 170bpm.  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  She reports that she has been eating healthier including salads, baked chicken, and less sodium.  She reports she has tried to cut back on pasta, rice, and potatoes-but has issues cutting these foods out of her diet.  She started GOLO 90 days ago and is frustrated since she has not lost any weight. She "just wants a pill" to help her lose the weight.   Elevated BP without dx of HTN-  She is concerned about her recent elevated BPs. Reports that her husband has had recent issues with HTN. Also reports that her mom suffered from HTN.   Review of Systems  Constitutional:  Negative for malaise/fatigue.  Respiratory:  Negative for cough and shortness of breath.   Cardiovascular:  Negative for chest pain, palpitations and claudication.  Gastrointestinal:  Negative for abdominal pain, nausea and vomiting.  Neurological:  Negative for headaches.  Psychiatric/Behavioral:  Negative for depression and suicidal ideas.     Objective:    BP (!) 143/90   Pulse 92   Ht 5\' 1"  (1.549 m)   Wt 194 lb (88 kg)   SpO2 100%   BMI 36.66 kg/m  BP Readings from Last 3 Encounters:  12/21/22 (!) 143/90  06/24/20 (!) 167/85  11/14/18 114/74    Physical Exam Constitutional:      Appearance: Normal appearance.  Cardiovascular:     Rate and Rhythm: Normal rate and regular rhythm.     Heart  sounds: Normal heart sounds.  Pulmonary:     Effort: Pulmonary effort is normal.     Breath sounds: Normal breath sounds.  Neurological:     Mental Status: She is alert.  Psychiatric:        Mood and Affect: Mood normal.        Behavior: Behavior normal.        Thought Content: Thought content normal.        Judgment: Judgment normal.     Assessment & Plan:   1. Encounter to establish care Patient is here to establish care.  She reports being concerned about her increase in weight and her elevated blood pressure recently. (See below.)   2. Elevated BP without diagnosis of hypertension Denies chest pain, palpitations, changes in vision, shortness of breath, lower extremity edema, headaches, lightheadedness, weakness, and cough.  Reports that she is looking to purchase a blood pressure cuff for home use. Educated patient how to take proper blood pressure and asked her to check BP a couple times each week. Educated her about following a low sodium diet, avoiding excess alcohol intake, quitting smoking, and continuing her exercise routine. Will treat at this time with lifestyle modifications, including dietary modifications and frequent aerobic exercise.   3. Adult BMI 36.0-36.9 kg/sq m Patient reports having issues with her weight despite focusing on  healthy lifestyle, including consuming less sodium, consuming more vegetables and exercising 4 times a week.  She reports that she has gained 50 pounds in the last year and is very frustrated with this. Educated her about the benefits of [facility]. At this time, she does not want a referral to [facility]. Encouraged patient to continue with healthy diet and frequent exercise.  Will reassess her desire for weight management assistance at physical.  4. Tobacco use Patient reports vaping.  She reports quitting cigarette smoking many years ago. She is not willing to quit at this time.  Educated patient  about negative health risks, including impact on blood pressure. Will continue to offer support for smoking cessation.   5. Wellness examination Placed orders for fasting labs prior to physical.  - CBC with Differential/Platelet; Future - COMPLETE METABOLIC PANEL WITH GFR; Future - Hemoglobin A1c; Future - Lipid panel; Future - TSH Rfx on Abnormal to Free T4; Future - Hepatitis C Antibody; Future   Return in about 6 weeks (around 02/01/2023) for Physical with fasting labs.    Les Pou, FNP

## 2023-02-05 ENCOUNTER — Encounter (HOSPITAL_BASED_OUTPATIENT_CLINIC_OR_DEPARTMENT_OTHER): Payer: BC Managed Care – PPO | Admitting: Family Medicine

## 2023-02-19 ENCOUNTER — Encounter (HOSPITAL_BASED_OUTPATIENT_CLINIC_OR_DEPARTMENT_OTHER): Payer: Self-pay | Admitting: Family Medicine

## 2023-02-19 ENCOUNTER — Ambulatory Visit (INDEPENDENT_AMBULATORY_CARE_PROVIDER_SITE_OTHER): Payer: BC Managed Care – PPO | Admitting: Family Medicine

## 2023-02-19 ENCOUNTER — Other Ambulatory Visit (HOSPITAL_COMMUNITY)
Admission: RE | Admit: 2023-02-19 | Discharge: 2023-02-19 | Disposition: A | Discharge status: Home / Self Care | Payer: BC Managed Care – PPO | Source: Ambulatory Visit | Attending: Family Medicine | Admitting: Family Medicine

## 2023-02-19 VITALS — BP 129/85 | HR 81 | Temp 97.6°F | Ht 61.0 in | Wt 194.0 lb

## 2023-02-19 DIAGNOSIS — Z1231 Encounter for screening mammogram for malignant neoplasm of breast: Secondary | ICD-10-CM | POA: Diagnosis not present

## 2023-02-19 DIAGNOSIS — Z124 Encounter for screening for malignant neoplasm of cervix: Secondary | ICD-10-CM

## 2023-02-19 DIAGNOSIS — Z1211 Encounter for screening for malignant neoplasm of colon: Secondary | ICD-10-CM | POA: Diagnosis not present

## 2023-02-19 DIAGNOSIS — Z0000 Encounter for general adult medical examination without abnormal findings: Secondary | ICD-10-CM

## 2023-02-19 DIAGNOSIS — Z6836 Body mass index (BMI) 36.0-36.9, adult: Secondary | ICD-10-CM

## 2023-02-19 DIAGNOSIS — Z1212 Encounter for screening for malignant neoplasm of rectum: Secondary | ICD-10-CM

## 2023-02-19 MED ORDER — ORLISTAT 60 MG PO CAPS: 60.0000 mg | ORAL_CAPSULE | Freq: Three times a day (TID) | ORAL | 2 refills | Status: DC

## 2023-02-19 NOTE — Progress Notes (Addendum)
Complete physical exam  Patient: Wendy Huerta   DOB: October 30, 1965   57 y.o. Female  MRN: [id number]  Subjective:    Chief Complaint  Patient presents with   Annual Exam    Pt here for annual exam     Wendy Huerta is a 57 y.o. female who presents today for a complete physical exam. She reports consuming a general diet. She is still participating in the [employer]. She is trying to stay away from carbohydrates. The patient does not participate in regular exercise at present. She generally feels well. She reports sleeping well. She does not have additional problems to discuss today.   Mammogram- reports she had this last done around 2014, placed order Pap- will complete today  Colonoscopy- Cologuard, placed order   Depression screenings:    02/19/2023    9:37 AM 12/21/2022    3:07 PM  Depression screen PHQ 2/9  Decreased Interest 3 1  Down, Depressed, Hopeless 0 0  PHQ - 2 Score 3 1  Altered sleeping 0 0  Tired, decreased energy 3 0  Change in appetite 3 0  Feeling bad or failure about yourself  0 1  Trouble concentrating 0 0  Moving slowly or fidgety/restless 0 0  Suicidal thoughts 0 0  PHQ-9 Score 9 2  Difficult doing work/chores Somewhat difficult Not difficult at all    Anxiety screenings:    02/19/2023    9:37 AM  GAD 7 : Generalized Anxiety Score  Nervous, Anxious, on Edge 2  Control/stop worrying 0  Worry too much - different things 0  Trouble relaxing 0  Restless 0  Easily annoyed or irritable 0  Afraid - awful might happen 0  Total GAD 7 Score 2  Anxiety Difficulty Not difficult at all   Vision:Within last year and Dental: Receives regular dental care  Patient Care Team: Alyson Reedy, FNP as PCP - General (Family Medicine)   Outpatient Medications Prior to Visit  Medication Sig   [DISCONTINUED] cetirizine (ZYRTEC) 10 MG tablet Take 1 tablet by mouth daily. (Patient not taking: Reported on 02/19/2023)   [DISCONTINUED] fluticasone (FLONASE) 50  MCG/ACT nasal spray Place into the nose. (Patient not taking: Reported on 02/19/2023)   [DISCONTINUED] ibuprofen (ADVIL) 600 MG tablet Take 600 mg by mouth every 6 (six) hours as needed. (Patient not taking: Reported on 02/19/2023)   No facility-administered medications prior to visit.    Review of Systems  Constitutional:  Negative for malaise/fatigue.  Eyes:  Negative for blurred vision and double vision.  Respiratory:  Negative for cough and shortness of breath.   Cardiovascular:  Negative for chest pain and palpitations.  Gastrointestinal:  Negative for abdominal pain, nausea and vomiting.  Genitourinary:  Negative for frequency and urgency.  Musculoskeletal:  Negative for myalgias.  Neurological:  Negative for dizziness, weakness and headaches.  Psychiatric/Behavioral:  Negative for depression, substance abuse and suicidal ideas. The patient is not nervous/anxious and does not have insomnia.        Objective:     BP 129/85 (BP Location: Right Arm, Patient Position: Sitting, Cuff Size: Normal)   Pulse 81   Temp 97.6 F (36.4 C) (Oral)   Ht 5\' 1"  (1.549 m)   Wt 194 lb (88 kg)   SpO2 100%   BMI 36.66 kg/m  BP Readings from Last 3 Encounters:  02/19/23 129/85  12/21/22 (!) 143/90  06/24/20 (!) 167/85     Physical Exam Exam conducted with a chaperone present.  Constitutional:      Appearance: Normal appearance.  HENT:     Right Ear: Tympanic membrane, ear canal and external ear normal.     Left Ear: Tympanic membrane, ear canal and external ear normal.     Nose: Nose normal.     Mouth/Throat:     Mouth: Mucous membranes are moist.     Pharynx: Oropharynx is clear.  Eyes:     Extraocular Movements: Extraocular movements intact.     Pupils: Pupils are equal, round, and reactive to light.  Cardiovascular:     Rate and Rhythm: Normal rate and regular rhythm.     Pulses: Normal pulses.     Heart sounds: Normal heart sounds.  Pulmonary:     Effort: Pulmonary effort is  normal.     Breath sounds: Normal breath sounds.  Abdominal:     General: Abdomen is flat. Bowel sounds are normal.     Palpations: Abdomen is soft.  Genitourinary:    General: Normal vulva.     Exam position: Lithotomy position.     Pubic Area: No rash.      Labia:        Right: No rash or tenderness.        Left: No rash or tenderness.      Vagina: Normal.     Cervix: Normal.  Musculoskeletal:        General: Normal range of motion.     Cervical back: Normal range of motion and neck supple.  Lymphadenopathy:     Lower Body: No right inguinal adenopathy. No left inguinal adenopathy.  Skin:    General: Skin is warm and dry.  Neurological:     Mental Status: She is alert.  Psychiatric:        Mood and Affect: Mood normal.        Behavior: Behavior normal.        Thought Content: Thought content normal.        Judgment: Judgment normal.     Assessment & Plan:    Routine Health Maintenance and Physical Exam  Health Maintenance  Topic Date Due   Hepatitis C Screening  Never done   DTaP/Tdap/Td vaccine (1 - Tdap) Never done   Colon Cancer Screening  Never done   Pap Smear  01/03/2016   Mammogram  07/23/2018   Zoster (Shingles) Vaccine (1 of 2) Never done   COVID-19 Vaccine (5 - 2023-24 season) 05/29/2022   Flu Shot  04/29/2023   HIV Screening  Completed   HPV Vaccine  Aged Out    1. Wellness examination Routine HCM labs ordered. Will obtain labs today and update patient with results.  Review of PMH, FH, SH, medications and HM performed.  Recommend healthy diet.  Recommend approximately 150 minutes/week of moderate intensity exercise. Recommend regular dental and vision exams. Always use seatbelt/lap and shoulder restraints. Recommend using smoke alarms and checking batteries at least twice a year. Recommend using sunscreen when outside. Discussed immunization recommendations for shingles & tetanus vaccine. Patient declines at this time, will consider.   Discussed  colon cancer screening recommendations and options.  Patient agreeable to Cologuard. Order placed.   Breast cancer screening recommendations reviewed. Patient is at the age (age 55-54) where recommendations are to start mammograms annually. Mammogram order placed.   Pap completed today with HPV.   - CBC with Differential/Platelet - Comprehensive metabolic panel - Hemoglobin A1c - Lipid panel - TSH Rfx on Abnormal to Free T4 - HCV  RNA quant rflx ultra or genotyp  2. Adult BMI 36.0-36.9 kg/sq m Patient interested in weight loss medication. Discussed various medication options, along with healthy lifestyle modifications including healthy diet and daily exercise. Patient prefers to try oral medication. Order placed for Orlistat. Will follow-up in 6 weeks to determine if medication management is beneficial.  - orlistat (ALLI) 60 MG capsule; Take 1 capsule (60 mg total) by mouth 3 (three) times daily with meals.  Dispense: 90 capsule; Refill: 2  3. Encounter for colorectal cancer screening using Cologuard test Patient agreeable to Cologuard. Order placed.  - Cologuard  4. Encounter for screening mammogram for malignant neoplasm of breast Patient agreeable to mammogram. Order placed.  - MM 3D SCREENING MAMMOGRAM BILATERAL BREAST; Future  5. Screening for cervical cancer Patient agreeable to Pap smear today. Exam completed with chaperone present. Order placed.  - Cytology - PAP  Return in about 6 weeks (around 04/02/2023) for weight loss .     Alyson Reedy, FNP

## 2023-02-20 LAB — CBC WITH DIFFERENTIAL/PLATELET
Basophils Absolute: 0 10*3/uL (ref 0.0–0.2)
Basos: 0 %
EOS (ABSOLUTE): 0.1 10*3/uL (ref 0.0–0.4)
Eos: 2 %
Hematocrit: 41 % (ref 34.0–46.6)
Hemoglobin: 13 g/dL (ref 11.1–15.9)
Immature Grans (Abs): 0 10*3/uL (ref 0.0–0.1)
Immature Granulocytes: 0 %
Lymphocytes Absolute: 1.5 10*3/uL (ref 0.7–3.1)
Lymphs: 33 %
MCH: 26.5 pg — ABNORMAL LOW (ref 26.6–33.0)
MCHC: 31.7 g/dL (ref 31.5–35.7)
MCV: 84 fL (ref 79–97)
Monocytes Absolute: 0.4 10*3/uL (ref 0.1–0.9)
Monocytes: 8 %
Neutrophils Absolute: 2.6 10*3/uL (ref 1.4–7.0)
Neutrophils: 57 %
Platelets: 252 10*3/uL (ref 150–450)
RBC: 4.9 x10E6/uL (ref 3.77–5.28)
RDW: 12.8 % (ref 11.7–15.4)
WBC: 4.6 10*3/uL (ref 3.4–10.8)

## 2023-02-20 LAB — HCV RNA QUANT RFLX ULTRA OR GENOTYP: HCV Quant Baseline: NOT DETECTED IU/mL

## 2023-02-20 LAB — HEMOGLOBIN A1C
Est. average glucose Bld gHb Est-mCnc: 134 mg/dL
Hgb A1c MFr Bld: 6.3 % — ABNORMAL HIGH (ref 4.8–5.6)

## 2023-02-20 LAB — COMPREHENSIVE METABOLIC PANEL
ALT: 28 IU/L (ref 0–32)
AST: 25 IU/L (ref 0–40)
Albumin/Globulin Ratio: 1.9 (ref 1.2–2.2)
Albumin: 4.6 g/dL (ref 3.8–4.9)
Alkaline Phosphatase: 115 IU/L (ref 44–121)
BUN/Creatinine Ratio: 15 (ref 9–23)
BUN: 14 mg/dL (ref 6–24)
Bilirubin Total: 0.4 mg/dL (ref 0.0–1.2)
CO2: 25 mmol/L (ref 20–29)
Calcium: 9.7 mg/dL (ref 8.7–10.2)
Chloride: 106 mmol/L (ref 96–106)
Creatinine, Ser: 0.95 mg/dL (ref 0.57–1.00)
Globulin, Total: 2.4 g/dL (ref 1.5–4.5)
Glucose: 88 mg/dL (ref 70–99)
Potassium: 4.4 mmol/L (ref 3.5–5.2)
Sodium: 145 mmol/L — ABNORMAL HIGH (ref 134–144)
Total Protein: 7 g/dL (ref 6.0–8.5)
eGFR: 71 mL/min/{1.73_m2} (ref >59)

## 2023-02-20 LAB — LIPID PANEL
Chol/HDL Ratio: 2.7 ratio (ref 0.0–4.4)
Cholesterol, Total: 146 mg/dL (ref 100–199)
HDL: 54 mg/dL (ref >39)
LDL Chol Calc (NIH): 79 mg/dL (ref 0–99)
Triglycerides: 64 mg/dL (ref 0–149)
VLDL Cholesterol Cal: 13 mg/dL (ref 5–40)

## 2023-02-20 LAB — TSH RFX ON ABNORMAL TO FREE T4: TSH: 0.681 u[IU]/mL (ref 0.450–4.500)

## 2023-02-26 LAB — CYTOLOGY - PAP
Adequacy: ABSENT
Comment: NEGATIVE
Diagnosis: UNDETERMINED — AB
High risk HPV: NEGATIVE

## 2023-03-01 ENCOUNTER — Telehealth (HOSPITAL_BASED_OUTPATIENT_CLINIC_OR_DEPARTMENT_OTHER): Payer: Self-pay | Admitting: Family Medicine

## 2023-03-01 NOTE — Telephone Encounter (Signed)
Please call the patient regarding Prescription

## 2023-03-01 NOTE — Telephone Encounter (Signed)
Spoke with patient claims Orlistat is an OTC medication per her pharmacy and she does not want that. She is agreeable to starting a GLP-1 as discussed with PCP at prior appointment

## 2023-03-02 ENCOUNTER — Other Ambulatory Visit (HOSPITAL_BASED_OUTPATIENT_CLINIC_OR_DEPARTMENT_OTHER): Payer: Self-pay | Admitting: Family Medicine

## 2023-03-02 DIAGNOSIS — Z6836 Body mass index (BMI) 36.0-36.9, adult: Secondary | ICD-10-CM

## 2023-03-02 MED ORDER — ZEPBOUND 2.5 MG/0.5ML ~~LOC~~ SOAJ: 2.5000 mg | SUBCUTANEOUS | 0 refills | Status: DC

## 2023-03-19 ENCOUNTER — Ambulatory Visit: Payer: BC Managed Care – PPO

## 2023-03-26 ENCOUNTER — Ambulatory Visit
Admission: RE | Admit: 2023-03-26 | Discharge: 2023-03-26 | Disposition: A | Discharge status: Home / Self Care | Payer: BC Managed Care – PPO | Source: Ambulatory Visit | Attending: Family Medicine | Admitting: Family Medicine

## 2023-03-26 DIAGNOSIS — Z1231 Encounter for screening mammogram for malignant neoplasm of breast: Secondary | ICD-10-CM | POA: Diagnosis not present

## 2023-04-06 ENCOUNTER — Telehealth (HOSPITAL_BASED_OUTPATIENT_CLINIC_OR_DEPARTMENT_OTHER): Payer: Self-pay | Admitting: Family Medicine

## 2023-04-06 DIAGNOSIS — Z6836 Body mass index (BMI) 36.0-36.9, adult: Secondary | ICD-10-CM

## 2023-04-06 NOTE — Telephone Encounter (Signed)
Patient calling in to talk about her weightloss meds. She states they are $1200 so she is not taking it. She wants a call back to see if there is something else and does she need to F/U 7/12

## 2023-04-06 NOTE — Telephone Encounter (Signed)
Spoke with patient, she is agreeable to a [facility] referral mentioned to her that most insurance companies are giving [place] issues with getting weight loss medications approved she voiced understanding

## 2023-04-07 ENCOUNTER — Telehealth: Payer: Self-pay | Admitting: *Deleted

## 2023-04-07 NOTE — Telephone Encounter (Signed)
Called pt about colon cancer screening. Pt has received cologuard. She will try to get this sent back in.

## 2023-04-09 ENCOUNTER — Ambulatory Visit (HOSPITAL_BASED_OUTPATIENT_CLINIC_OR_DEPARTMENT_OTHER): Payer: BC Managed Care – PPO | Admitting: Family Medicine

## 2023-08-19 ENCOUNTER — Encounter (HOSPITAL_BASED_OUTPATIENT_CLINIC_OR_DEPARTMENT_OTHER): Payer: Self-pay | Admitting: Family Medicine

## 2024-02-21 ENCOUNTER — Encounter (HOSPITAL_BASED_OUTPATIENT_CLINIC_OR_DEPARTMENT_OTHER): Payer: Self-pay | Admitting: Family Medicine

## 2024-02-21 DIAGNOSIS — R7303 Prediabetes: Secondary | ICD-10-CM | POA: Insufficient documentation

## 2024-02-22 ENCOUNTER — Encounter (HOSPITAL_BASED_OUTPATIENT_CLINIC_OR_DEPARTMENT_OTHER): Payer: BC Managed Care – PPO | Admitting: Family Medicine

## 2024-05-13 ENCOUNTER — Other Ambulatory Visit: Payer: Self-pay

## 2024-05-13 ENCOUNTER — Emergency Department (HOSPITAL_COMMUNITY)
Admission: EM | Admit: 2024-05-13 | Discharge: 2024-05-13 | Disposition: A | Discharge status: Home / Self Care | Attending: Emergency Medicine | Admitting: Emergency Medicine

## 2024-05-13 ENCOUNTER — Encounter (HOSPITAL_COMMUNITY): Payer: Self-pay | Admitting: *Deleted

## 2024-05-13 DIAGNOSIS — R112 Nausea with vomiting, unspecified: Secondary | ICD-10-CM

## 2024-05-13 DIAGNOSIS — D72829 Elevated white blood cell count, unspecified: Secondary | ICD-10-CM | POA: Diagnosis not present

## 2024-05-13 DIAGNOSIS — F10129 Alcohol abuse with intoxication, unspecified: Secondary | ICD-10-CM | POA: Insufficient documentation

## 2024-05-13 DIAGNOSIS — Y901 Blood alcohol level of 20-39 mg/100 ml: Secondary | ICD-10-CM | POA: Insufficient documentation

## 2024-05-13 DIAGNOSIS — R001 Bradycardia, unspecified: Secondary | ICD-10-CM | POA: Diagnosis not present

## 2024-05-13 DIAGNOSIS — F10920 Alcohol use, unspecified with intoxication, uncomplicated: Secondary | ICD-10-CM

## 2024-05-13 LAB — COMPREHENSIVE METABOLIC PANEL WITH GFR
ALT: 22 U/L (ref 0–44)
AST: 32 U/L (ref 15–41)
Albumin: 4.6 g/dL (ref 3.5–5.0)
Alkaline Phosphatase: 81 U/L (ref 38–126)
Anion gap: 16 — ABNORMAL HIGH (ref 5–15)
BUN: 15 mg/dL (ref 6–20)
CO2: 20 mmol/L — ABNORMAL LOW (ref 22–32)
Calcium: 9.6 mg/dL (ref 8.9–10.3)
Chloride: 108 mmol/L (ref 98–111)
Creatinine, Ser: 0.82 mg/dL (ref 0.44–1.00)
GFR, Estimated: >60 mL/min (ref >60)
Glucose, Bld: 73 mg/dL (ref 70–99)
Potassium: 4.2 mmol/L (ref 3.5–5.1)
Sodium: 144 mmol/L (ref 135–145)
Total Bilirubin: 0.6 mg/dL (ref 0.0–1.2)
Total Protein: 8 g/dL (ref 6.5–8.1)

## 2024-05-13 LAB — CBC WITH DIFFERENTIAL/PLATELET
Abs Immature Granulocytes: 0.1 K/uL — ABNORMAL HIGH (ref 0.00–0.07)
Basophils Absolute: 0 K/uL (ref 0.0–0.1)
Basophils Relative: 0 %
Eosinophils Absolute: 0 K/uL (ref 0.0–0.5)
Eosinophils Relative: 0 %
HCT: 44.6 % (ref 36.0–46.0)
Hemoglobin: 13.7 g/dL (ref 12.0–15.0)
Immature Granulocytes: 1 %
Lymphocytes Relative: 8 %
Lymphs Abs: 1.2 K/uL (ref 0.7–4.0)
MCH: 27.1 pg (ref 26.0–34.0)
MCHC: 30.7 g/dL (ref 30.0–36.0)
MCV: 88.3 fL (ref 80.0–100.0)
Monocytes Absolute: 0.4 K/uL (ref 0.1–1.0)
Monocytes Relative: 3 %
Neutro Abs: 12.3 K/uL — ABNORMAL HIGH (ref 1.7–7.7)
Neutrophils Relative %: 88 %
Platelets: 289 K/uL (ref 150–400)
RBC: 5.05 MIL/uL (ref 3.87–5.11)
RDW: 13.3 % (ref 11.5–15.5)
WBC: 14 K/uL — ABNORMAL HIGH (ref 4.0–10.5)
nRBC: 0 % (ref 0.0–0.2)

## 2024-05-13 LAB — ETHANOL: Alcohol, Ethyl (B): 25 mg/dL — ABNORMAL HIGH (ref <15)

## 2024-05-13 MED ORDER — ONDANSETRON 4 MG PO TBDP
4.0000 mg | ORAL_TABLET | Freq: Once | ORAL | Status: AC
  Administered 2024-05-13: 4 mg via ORAL
  Filled 2024-05-13: qty 1

## 2024-05-13 MED ORDER — LACTATED RINGERS IV BOLUS
1000.0000 mL | Freq: Once | INTRAVENOUS | Status: AC
  Administered 2024-05-13: 1000 mL via INTRAVENOUS

## 2024-05-13 MED ORDER — ONDANSETRON 4 MG PO TBDP: 4.0000 mg | ORAL_TABLET | Freq: Three times a day (TID) | ORAL | 0 refills | Status: AC | PRN

## 2024-05-13 NOTE — ED Notes (Signed)
 Pt connected to monitor and provided blankets.

## 2024-05-13 NOTE — ED Provider Notes (Signed)
 [place] EMERGENCY DEPARTMENT AT [facility] Provider Note   CSN: [id number] Arrival date & time: 05/13/24  1243     Patient presents with: Emesis and Nausea   Wendy Huerta is a 58 y.o. female.  Patient with past history significant for prediabetes presents the emergency department with concerns of nausea and vomiting.  Patient reports drinking alcohol last night with 4 shots of vodka and has had vomiting since late last night early this morning.  She reports that she typically drinks over 2-3 shots per day and is unsure why she notably worsened last night.  Denies any diarrhea but does endorse some mild abdominal cramping with the vomiting.  Has not been able to tolerate p.o.  No reported fever, chills, body aches.    Emesis      Prior to Admission medications   Medication Sig Start Date End Date Taking? Authorizing Provider  ondansetron  (ZOFRAN -ODT) 4 MG disintegrating tablet Take 1 tablet (4 mg total) by mouth every 8 (eight) hours as needed for nausea or vomiting. 05/13/24  Yes Kayton Dunaj A, PA-C    Allergies: Codeine    Review of Systems  Gastrointestinal:  Positive for nausea and vomiting.  All other systems reviewed and are negative.   Updated Vital Signs BP 138/80   Pulse (!) 57   Temp 97.8 F (36.6 C) (Oral)   Resp 16   Ht 5' 1 (1.549 m)   Wt 88 kg   SpO2 99%   BMI 36.66 kg/m   Physical Exam Vitals and nursing note reviewed.  Constitutional:      General: She is not in acute distress.    Appearance: She is well-developed.  HENT:     Head: Normocephalic and atraumatic.  Eyes:     Conjunctiva/sclera: Conjunctivae normal.  Cardiovascular:     Rate and Rhythm: Normal rate and regular rhythm.     Heart sounds: No murmur heard. Pulmonary:     Effort: Pulmonary effort is normal. No respiratory distress.     Breath sounds: Normal breath sounds.  Abdominal:     General: Abdomen is flat. Bowel sounds are normal. There is no distension.      Palpations: Abdomen is soft.     Tenderness: There is no abdominal tenderness. There is no guarding.  Musculoskeletal:        General: No swelling.     Cervical back: Neck supple.  Skin:    General: Skin is warm and dry.     Capillary Refill: Capillary refill takes less than 2 seconds.  Neurological:     Mental Status: She is alert.  Psychiatric:        Mood and Affect: Mood normal.     (all labs ordered are listed, but only abnormal results are displayed) Labs Reviewed  CBC WITH DIFFERENTIAL/PLATELET - Abnormal; Notable for the following components:      Result Value   WBC 14.0 (*)    Neutro Abs 12.3 (*)    Abs Immature Granulocytes 0.10 (*)    All other components within normal limits  COMPREHENSIVE METABOLIC PANEL WITH GFR - Abnormal; Notable for the following components:   CO2 20 (*)    Anion gap 16 (*)    All other components within normal limits  ETHANOL - Abnormal; Notable for the following components:   Alcohol, Ethyl (B) 25 (*)    All other components within normal limits    EKG: EKG Interpretation Date/Time:  Saturday May 13 2024 13:57:20  EDT Ventricular Rate:  50 PR Interval:  136 QRS Duration:  86 QT Interval:  512 QTC Calculation: 466 R Axis:   75  Text Interpretation: Sinus bradycardia with sinus arrhythmia Non-specific ST-t changes Confirmed by Bernard Drivers ([id number]) on 05/13/2024 4:53:41 PM  Radiology: No results found.   Procedures   Medications Ordered in the ED  ondansetron  (ZOFRAN -ODT) disintegrating tablet 4 mg (4 mg Oral Given 05/13/24 1349)  lactated ringers  bolus 1,000 mL (0 mLs Intravenous Stopped 05/13/24 1835)                                    Medical Decision Making Amount and/or Complexity of Data Reviewed Labs: ordered.  Risk Prescription drug management.   This patient presents to the ED for concern of nausea and vomiting. Differential diagnosis includes alcohol intoxication, nausea, vomiting, bowel obstruction    Lab  Tests:  I Ordered, and personally interpreted labs.  The pertinent results include: CBC with mild leukocytosis at 14.0 likely reactive in nature, CMP with slight elevation in anion gap at 16, ethanol elevated at 25   Medicines ordered and prescription drug management:  I ordered medication including Zofran , fluids for nausea, dehydration Reevaluation of the patient after these medicines showed that the patient improved I have reviewed the patients home medicines and have made adjustments as needed   Problem List / ED Course:  Patient past history significant for prediabetes presents the emergency department concerns of nausea and vomiting.  Reportedly drank alcohol last night with 4 shots of vodka and has had vomiting since early this morning.  She does report baseline drinking of 2-3 shots per day he says she is unsure why she had notable worsening of her symptoms.  No reported diarrhea or fevers. On exam after Zofran , patient reports significant improvement in nausea.  Not actively vomiting at this time and she denies any abdominal tenderness.  She did report some mild soreness but believes that that was likely due to the dry heaving.  Lab work obtained from triage shows mild anion gap at 16.  Will give fluids and attempt resuscitation and attempt p.o. Patient slight leukocytosis of 14.0 I believe this is likely secondary to reactive changes from her vomiting.  There is no abdominal tenderness, fever, diarrhea, or urinary symptoms to suggest acutely infectious process.  Will reassess shortly and if tolerating p.o., anticipate that she can safely be discharged home. Patient tolerating p.o.  Feels significantly better after fluid resuscitation.  No active vomiting has been seen in several hours.  Will discharge home with Zofran  for continued management of nausea.  Return precautions discussed such as concerns for new or worsening symptoms.  Otherwise stable for outpatient follow-up and discharged  home.   Social Determinants of Health:  None  Final diagnoses:  Alcoholic intoxication without complication (HCC)  Nausea and vomiting, unspecified vomiting type    ED Discharge Orders          Ordered    ondansetron  (ZOFRAN -ODT) 4 MG disintegrating tablet  Every 8 hours PRN        05/13/24 1559               Marely Apgar A, PA-C 05/14/24 1232    Bernard Drivers, MD 05/15/24 1008

## 2024-05-13 NOTE — Discharge Instructions (Signed)
 You were seen in the ER today for concerns of nausea and vomiting. Your labs showed very minimal dehydration and you responded well to nausea medications and fluids. Please reduce your alcohol intake as this appears to have been the likely cause of the vomiting today. I have sent a prescription for nausea medicine to your pharmacy. Take this as prescribed. For any concerns of new or worsening symptoms, return to the ER.

## 2024-05-13 NOTE — ED Triage Notes (Signed)
 Patient states she had 4 shots of volka last pm this am c/o n/v, states she drinks 2-3 shots everyday, patient is currently sticking her finger down her throat to vomit

## 2024-05-13 NOTE — ED Provider Triage Note (Signed)
 Emergency Medicine Provider Triage Evaluation Note  Wendy Huerta , a 58 y.o. female  was evaluated in triage.  Pt complains of nausea vomiting.  Patient reports alcohol use last night with 4 shots of vodka.  No history of significant alcohol use.  Denies any other substance use.  States that she is not tolerating p.o and has had some around 8 episodes of vomiting today.  Spouse is in the room and is concerned the patient is now acutely dehydrated.  No reported diarrhea.  Review of Systems  Positive: As above Negative: As above  Physical Exam  BP 131/65 (BP Location: Left Arm)   Pulse (!) 44   Temp (!) 97 F (36.1 C) (Axillary)   Resp 18   Ht 5' 1 (1.549 m)   Wt 88 kg   SpO2 99%   BMI 36.66 kg/m  Gen:   Awake, no distress   Resp:  Normal effort  MSK:   Moves extremities without difficulty  Other:    Medical Decision Making  Medically screening exam initiated at 1:45 PM.  Appropriate orders placed.  Wendy Huerta was informed that the remainder of the evaluation will be completed by another provider, this initial triage assessment does not replace that evaluation, and the importance of remaining in the ED until their evaluation is complete.     Eligh Rybacki A, PA-C 05/13/24 1345

## 2024-05-13 NOTE — ED Notes (Signed)
 Pt Dcd with pt education and verbalized understanding. NAD and VSS.

## 2024-05-30 ENCOUNTER — Ambulatory Visit: Payer: Self-pay

## 2024-05-30 ENCOUNTER — Telehealth (HOSPITAL_BASED_OUTPATIENT_CLINIC_OR_DEPARTMENT_OTHER): Payer: Self-pay

## 2024-05-30 ENCOUNTER — Ambulatory Visit: Payer: Self-pay | Admitting: *Deleted

## 2024-05-30 NOTE — Telephone Encounter (Signed)
 FYI Only or Action Required?: Action required by provider: request for appointment and scheduled TOC appt .  Patient was last seen in primary care on 02/19/2023 by Towana Small, FNP.  Called Nurse Triage reporting Anxiety.  Symptoms began approx 1 year .  Interventions attempted: Rest, hydration, or home remedies.  Symptoms are: gradually worsening.  Triage Disposition:  No disposition on file.  Patient/caregiver understands and will follow disposition?: yes      Copied from CRM #[id number]. Topic: Clinical - Red Word Triage >> May 30, 2024 11:10 AM Donna BRAVO wrote: Red Word that prompted transfer to Nurse Triage:  Patient states she is struggling with anxiety, that is getting worse    ----------------------------------------------------------------------- From previous Reason for Contact - Scheduling: Patient/patient representative is calling to schedule an appointment. Refer to attachments for appointment information.  Patient returning calling Note:Kiser, Randall BROCKS    05/30/24 11:01 AM Note Called patient to schedule a TOC appt   Patient scheduling appt for   Patient states she is struggling with anxiety, that is getting worse Answer Assessment - Initial Assessment Questions Offered mobile bus address today to be seen. New patient TOC appt scheduled for 06/01/24 at [facility] . Recommended if sx worsen go to ED.        1. CONCERN: Did anything happen that prompted you to call today?      Worsening anxiety ,does not go anywhere  only home to work 2. ANXIETY SYMPTOMS: Can you describe how you (your loved one; patient) have been feeling? (e.g., tense, restless, panicky, anxious, keyed up, overwhelmed, sense of impending doom).      Feels grouchy mouthy, difficulty sleeping sweating at night. Anxious  3. ONSET: How long have you been feeling this way? (e.g., hours, days, weeks)     Past year  4. SEVERITY: How would you rate the level of anxiety? (e.g., 0 - 10; or mild,  moderate, severe).     flucutates 5. FUNCTIONAL IMPAIRMENT: How have these feelings affected your ability to do daily activities? Have you had more difficulty than usual doing your normal daily activities? (e.g., getting better, same, worse; self-care, school, work, interactions)     Does not go any where but work and home does not stop by store does not go anywhere but home and work 6. HISTORY: Have you felt this way before? Have you ever been diagnosed with an anxiety problem in the past? (e.g., generalized anxiety disorder, panic attacks, PTSD). If Yes, ask: How was this problem treated? (e.g., medicines, counseling, etc.)     Yes anxiety  7. RISK OF HARM - SUICIDAL IDEATION: Do you ever have thoughts of hurting or killing yourself? If Yes, ask:  Do you have these feelings now? Do you have a plan on how you would do this?      8. TREATMENT:  What has been done so far to treat this anxiety? (e.g., medicines, relaxation strategies). What has helped?     na 9. THERAPIST: Do you have a counselor or therapist? If Yes, ask: What is their name?     na 10. POTENTIAL TRIGGERS: Do you drink caffeinated beverages (e.g., coffee, colas, teas), and how much daily? Do you drink alcohol or use any drugs? Have you started any new medicines recently?       Drinks alcohol 11. PATIENT SUPPORT: Who is with you now? Who do you live with? Do you have family or friends who you can talk to?  Husband  12. OTHER SYMPTOMS: Do you have any other symptoms? (e.g., feeling depressed, trouble concentrating, trouble sleeping, trouble breathing, palpitations or fast heartbeat, chest pain, sweating, nausea, or diarrhea)       Trouble sleeping , anxious at times, nerves shaking  13. PREGNANCY: Is there any chance you are pregnant? When was your last menstrual period?       na  Protocols used: Anxiety and Panic Attack-A-AH

## 2024-05-30 NOTE — Telephone Encounter (Signed)
 Pt is a former pt of [facility]. Pt was seen in the ED,not actually hospitalized. If pt wants to stay at [facility], pt will need to schedule appt to get established with Thersia.

## 2024-05-30 NOTE — Telephone Encounter (Signed)
 Called patient to schedule a TOC appt

## 2024-05-30 NOTE — Telephone Encounter (Signed)
 Reason for Disposition . MODERATE anxiety (e.g., persistent or frequent anxiety symptoms; interferes with sleep, school, or work)  Protocols used: Anxiety and Panic Attack-A-AH

## 2024-05-30 NOTE — Telephone Encounter (Signed)
 Pt states that she is calling to schedule a hosp f/u. Pt requesting to be seen today at [facility]. Pt was discharged on 8/16. States not having s/s currently. States she took her medication for nausea.

## 2024-06-01 ENCOUNTER — Other Ambulatory Visit (HOSPITAL_BASED_OUTPATIENT_CLINIC_OR_DEPARTMENT_OTHER): Payer: Self-pay

## 2024-06-01 ENCOUNTER — Ambulatory Visit (HOSPITAL_BASED_OUTPATIENT_CLINIC_OR_DEPARTMENT_OTHER): Admitting: Family Medicine

## 2024-06-01 ENCOUNTER — Encounter (HOSPITAL_BASED_OUTPATIENT_CLINIC_OR_DEPARTMENT_OTHER): Payer: Self-pay | Admitting: Family Medicine

## 2024-06-01 VITALS — BP 148/90 | HR 85 | Ht 61.0 in | Wt 147.7 lb

## 2024-06-01 DIAGNOSIS — R030 Elevated blood-pressure reading, without diagnosis of hypertension: Secondary | ICD-10-CM | POA: Diagnosis not present

## 2024-06-01 DIAGNOSIS — F411 Generalized anxiety disorder: Secondary | ICD-10-CM | POA: Diagnosis not present

## 2024-06-01 DIAGNOSIS — F330 Major depressive disorder, recurrent, mild: Secondary | ICD-10-CM | POA: Diagnosis not present

## 2024-06-01 MED ORDER — BUPROPION HCL ER (XL) 150 MG PO TB24
150.0000 mg | ORAL_TABLET | Freq: Every day | ORAL | 3 refills | Status: DC
  Filled 2024-06-01 (×2): qty 30, 30d supply, fill #0

## 2024-06-01 NOTE — Patient Instructions (Signed)
 Counseling and Mental Health Resources   [facility]  - For Women and Girls only - Cost based upon sliding scale of income - Financial Aid available  [phone] [address], [address] [place], [place] [address] [facility]  - Mental Health, Substance Abuse Treatment - IV Ketamine, Hydration and Weight Loss Programs - Center for Treatment for Resistant Depression and Suicidal Ideation  [address] [place], [place] [address]  [phone] [email or web address]   [facility]  - Individual and Family Counseling - Assessments and Therapy for Learning Disabilities, ADHD, Autism Spectrum Disorder, Processing Deficits  [phone] [address] [place], [place] [address]  [facility]  [address] [place] [address] [place], [place] [address]  [phone]  [facility]  - Individual, Family, Play and Group Therapy - In person and telehealth sessions available  Phone: [phone] Email: [email or web address]  [place] Location:   [address] [place] [address] [place], [place] [address]   [facility] Location:   [address] [place], [place] [address] Su Ley MA Clinical Psychology  [address] [place] [address]  [phone]  [facility], [facility]  Adult, Adolescent and [facility]  [address], [place], [place] [address]  Text:  [phone]   Call:  [phone] Email: [email or web address] [facility] - [facility]    [facility]  - Individual, Group Therapy - Day Programs, Wellness Coaching - Staff Programming, Workshops  [address], [place], [place] [address]  [phone]  [facility], [facility]  - Children, Adolescent, Adult and Family  Therapy  [place] Location [phone]  [address] [place], [place] [address]   [place] Location [phone]  [address] [place], [place] [address]   [facility]  - Indivudual Counseling, [facility] [address] [place], [place] [address]  [phone] [facility]  -Services at "less than a basic fee" sponsored by [facility]  [address] [place], [place] [address]  [phone]   [facility]  Counseling offered by [facility] [employer]  - Majority of Patients qualify for financial assistance   [address] [place], [place] [address]  [phone]

## 2024-06-01 NOTE — Progress Notes (Signed)
 Subjective:   Wendy Huerta April 07, 1966 06/01/2024  Chief Complaint  Patient presents with   New Patient (Initial Visit)    Patient is here today to get established with the practice. States she has been having problems with anxiety and has been having problems with nausea/vomiting and was seen in the ED.    HPI: Wendy Huerta presents today for re-assessment and management of chronic medical conditions.   ANXIETY AND DEPRESSION:  Patient states she has experienced chronic anxiety and fluctuating depression and mood that is worsening with frequent tearful episodes and impaired sleep. She works for [employer]. She finds she is blaming her husband more frequently and not feeling like herself. She is changing jobs to see if this helps with stress and anxiety. She has made adjustment with weight and feels like her anxiety has worsened. She is afraid to continue losing weight as she feels she will stop eating, moving, and living. She walks on her lunch break and has had considerable weight loss intentionally. She does self care with baths, relaxation. She does find that alcohol triggers her anxiety and decreased appetite. She previously was on Paxil  for anxiety.   She drinks 1 drink daily. She does vape occasionally.    Wt Readings from Last 3 Encounters:  06/01/24 147 lb 11.2 oz (67 kg)  05/13/24 194 lb 0.1 oz (88 kg)  02/19/23 194 lb (88 kg)       06/01/2024    9:48 AM 02/19/2023    9:37 AM  GAD 7 : Generalized Anxiety Score  Nervous, Anxious, on Edge 3 2  Control/stop worrying 0 0  Worry too much - different things 3 0  Trouble relaxing 2 0  Restless 3 0  Easily annoyed or irritable 3 0  Afraid - awful might happen 3 0  Total GAD 7 Score 17 2  Anxiety Difficulty Very difficult Not difficult at all       06/01/2024    9:46 AM 02/19/2023    9:37 AM 12/21/2022    3:07 PM  Depression screen PHQ 2/9  Decreased Interest 0 3 1  Down, Depressed, Hopeless 3 0 0  PHQ - 2  Score 3 3 1   Altered sleeping 3 0 0  Tired, decreased energy 1 3 0  Change in appetite 1 3 0  Feeling bad or failure about yourself  1 0 1  Trouble concentrating 2 0 0  Moving slowly or fidgety/restless 3 0 0  Suicidal thoughts 0 0 0  PHQ-9 Score 14 9 2   Difficult doing work/chores Somewhat difficult Somewhat difficult Not difficult at all      The following portions of the patient's history were reviewed and updated as appropriate: past medical history, past surgical history, family history, social history, allergies, medications, and problem list.   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   Prediabetes 02/21/2024   Adult BMI 36.0-36.9 kg/sq m 12/21/2022   Elevated BP without diagnosis of hypertension 12/21/2022   GAD (generalized anxiety disorder) 11/25/2006   TOBACCO DEPENDENCE 11/25/2006   Past Medical History:  Diagnosis Date   Anxiety    IRREGULAR MENSTRUATION 01/18/2007   Qualifier: Diagnosis of   By: Lavelle CMA,, Thekla       Panic attacks    VERTIGO NOS OR DIZZINESS 11/25/2006   Qualifier: Diagnosis of   By: WATT, JOANNE       Past  Surgical History:  Procedure Laterality Date   CARPAL TUNNEL RELEASE     CESAREAN SECTION     endometrial ablasion     Family History  Problem Relation Age of Onset   Hypertension Mother        age 27 died   Diabetes Mellitus II Mother    Outpatient Medications Prior to Visit  Medication Sig Dispense Refill   ondansetron  (ZOFRAN -ODT) 4 MG disintegrating tablet Take 1 tablet (4 mg total) by mouth every 8 (eight) hours as needed for nausea or vomiting. 20 tablet 0   No facility-administered medications prior to visit.   Allergies  Allergen Reactions   Codeine Nausea Only     ROS: A complete ROS was performed with pertinent positives/negatives noted in the HPI. The remainder of the ROS are negative.    Objective:   Today's Vitals   06/01/24  0941 06/01/24 1019  BP: (!) 156/114 (!) 148/90  Pulse: 85   SpO2: 100%   Weight: 147 lb 11.2 oz (67 kg)   Height: 5' 1 (1.549 m)     Physical Exam   GENERAL: Well-appearing, in NAD. Well nourished.  SKIN: Pink, warm and dry.  Head: Normocephalic. NECK: Trachea midline. Full ROM w/o pain or tenderness.  RESPIRATORY: Chest wall symmetrical. Respirations even and non-labored. Breath sounds clear to auscultation bilaterally.  CARDIAC: S1, S2 present, regular rate and rhythm without murmur or gallops. Peripheral pulses 2+ bilaterally.  MSK: Muscle tone and strength appropriate for age. NEUROLOGIC: No motor or sensory deficits. Steady, even gait. C2-C12 intact.  PSYCH/MENTAL STATUS: Alert, oriented x 3. Cooperative, anxious, tearful mood and affect.   Health Maintenance Due  Topic Date Due   DTaP/Tdap/Td (1 - Tdap) Never done   Hepatitis B Vaccines 19-59 Average Risk (1 of 3 - 19+ 3-dose series) Never done   Fecal DNA (Cologuard)  Never done   Pneumococcal Vaccine: 50+ Years (1 of 1 - PCV) Never done   Zoster Vaccines- Shingrix (1 of 2) Never done   COVID-19 Vaccine (5 - 2025-26 season) 05/29/2024    No results found for any visits on 06/01/24.  The 10-year ASCVD risk score (Arnett DK, et al., 2019) is: 4%     Assessment & Plan:  1. GAD (generalized anxiety disorder) (Primary) 2. Mild episode of recurrent major depressive disorder (HCC) PCP had lengthy discussion regarding ongoing stressors and triggers that may contribute to worsening anxiety and depression.  She was congratulated on intentional weight loss, but recommended increasing nutrition and healthy habits.  Counseling has been recommended and local resources provided.  Patient would like to trial Wellbutrin  for ongoing anxiety and depression.  Will start Wellbutrin  150 mg XL daily and follow-up in 4 to 6 weeks or sooner as needed.  Safety plan reviewed with patient.  3. Elevated BP without diagnosis of  hypertension Patient has no history of hypertension.  She believes that elevated blood pressure is likely related to anxiety.  Recommend she return in 1 week for blood pressure check and if elevated will consider antihypertensive therapy.    Meds ordered this encounter  Medications   buPROPion  (WELLBUTRIN  XL) 150 MG 24 hr tablet    Sig: Take 1 tablet (150 mg total) by mouth daily.    Dispense:  30 tablet    Refill:  3    Supervising Provider:   DE PERU, RAYMOND J [[id number]]   Lab Orders  No laboratory test(s) ordered today   No images are attached to the  encounter or orders placed in the encounter.  Return for 1 week BP Check ; 6 weeks AE, follow up GAD.    Patient to reach out to office if new, worrisome, or unresolved symptoms arise or if no improvement in patient's condition. Patient verbalized understanding and is agreeable to treatment plan. All questions answered to patient's satisfaction.    Thersia Schuyler Stark, [place]

## 2024-06-09 ENCOUNTER — Ambulatory Visit (INDEPENDENT_AMBULATORY_CARE_PROVIDER_SITE_OTHER): Admitting: *Deleted

## 2024-06-09 ENCOUNTER — Encounter (HOSPITAL_BASED_OUTPATIENT_CLINIC_OR_DEPARTMENT_OTHER): Payer: Self-pay | Admitting: Family Medicine

## 2024-06-09 VITALS — BP 140/98

## 2024-06-09 DIAGNOSIS — R030 Elevated blood-pressure reading, without diagnosis of hypertension: Secondary | ICD-10-CM | POA: Diagnosis not present

## 2024-06-09 NOTE — Progress Notes (Signed)
 Patient came in today for BP check. BP checked twice and both readings have been documented. Patient states that she has been taking buprion every day at the same time each day but states she is still having days where she is afraid to drive to work and has to take an uber and has days where she feels fine.

## 2024-06-29 ENCOUNTER — Other Ambulatory Visit (HOSPITAL_BASED_OUTPATIENT_CLINIC_OR_DEPARTMENT_OTHER): Payer: Self-pay

## 2024-07-14 ENCOUNTER — Ambulatory Visit (INDEPENDENT_AMBULATORY_CARE_PROVIDER_SITE_OTHER): Admitting: Family Medicine

## 2024-07-14 ENCOUNTER — Encounter (HOSPITAL_BASED_OUTPATIENT_CLINIC_OR_DEPARTMENT_OTHER): Payer: Self-pay | Admitting: Family Medicine

## 2024-07-14 VITALS — BP 128/86 | HR 87 | Ht 61.0 in | Wt 145.0 lb

## 2024-07-14 DIAGNOSIS — F411 Generalized anxiety disorder: Secondary | ICD-10-CM | POA: Diagnosis not present

## 2024-07-14 DIAGNOSIS — N951 Menopausal and female climacteric states: Secondary | ICD-10-CM | POA: Diagnosis not present

## 2024-07-14 MED ORDER — BUPROPION HCL ER (XL) 300 MG PO TB24: 300.0000 mg | ORAL_TABLET | Freq: Every day | ORAL | 5 refills | Status: AC

## 2024-07-14 NOTE — Patient Instructions (Signed)
 Counseling and Mental Health Resources   [facility]  - For Women and Girls only - Cost based upon sliding scale of income - Financial Aid available  [phone] [address], [address] [place], [place] [address] [facility]  - Mental Health, Substance Abuse Treatment - IV Ketamine, Hydration and Weight Loss Programs - Center for Treatment for Resistant Depression and Suicidal Ideation  [address] [place], [place] [address]  [phone] [email or web address]   [facility]  - Individual and Family Counseling - Assessments and Therapy for Learning Disabilities, ADHD, Autism Spectrum Disorder, Processing Deficits  [phone] [address] [place], [place] [address]  [facility]  [address] [place] [address] [place], [place] [address]  [phone]  [facility]  - Individual, Family, Play and Group Therapy - In person and telehealth sessions available  Phone: [phone] Email: [email or web address]  [place] Location:   [address] [place] [address] [place], [place] [address]   [facility] Location:   [address] [place], [place] [address] Su Ley MA Clinical Psychology  [address] [place] [address]  [phone]  [facility], [facility]  Adult, Adolescent and [facility]  [address], [place], [place] [address]  Text:  [phone]   Call:  [phone] Email: [email or web address] [facility] - [facility]    [facility]  - Individual, Group Therapy - Day Programs, Wellness Coaching - Staff Programming, Workshops  [address], [place], [place] [address]  [phone]  [facility], [facility]  - Children, Adolescent, Adult and Family  Therapy  [place] Location [phone]  [address] [place], [place] [address]   [place] Location [phone]  [address] [place], [place] [address]   [facility]  - Indivudual Counseling, [facility] [address] [place], [place] [address]  [phone] [facility]  -Services at "less than a basic fee" sponsored by [facility]  [address] [place], [place] [address]  [phone]   [facility]  Counseling offered by [facility] [employer]  - Majority of Patients qualify for financial assistance   [address] [place], [place] [address]  [phone]

## 2024-07-14 NOTE — Progress Notes (Addendum)
 Subjective:   Wendy Huerta 1965-11-14 07/14/2024  Chief Complaint  Patient presents with   Medical Management of Chronic Issues    Pt was originally scheduled today for her physical but instead of having her physical today, she is here today due to needing to get her medication straightened out for BP and anxiety/depression. States she never was able to see mychart message sent about prior BP nurse visit.    Discussed the use of AI scribe software for clinical note transcription with the patient, who gave verbal consent to proceed.  History of Present Illness Wendy Huerta is a 58 year old female with anxiety and menopausal symptoms who presents with persistent irritability and night sweats.  She experiences persistent irritability and anxiety despite being on Wellbutrin . The medication helps her focus at work but does not alleviate her irritability, which she describes as being 'so irritated' and 'always scared'. She reports most irritation is due to her workplace. The anxiety is not as severe as it was previously but is still present. Initially, Wellbutrin  provided relief for the first three days, but symptoms returned on the fourth day. She continues to take the medication as advised.  She experiences night sweats every night. Paxil , which she previously took, helped with sweating but caused weight gain and dry mouth. She is hesitant to switch medications or add new ones due to concerns about side effects, including potential impacts on her hair, which she observed in her mother.  She has a busy schedule, which makes it difficult to attend counseling sessions, although she is open to virtual options. She has noticed some improvement in her symptoms after removing a significant stressor from her life since her last visit. She is currently taking Wellbutrin  and prefers maintaining her current regimen to avoid taking multiple medications. No interest in hormone replacement therapy  due to concerns about side effects.      07/14/2024   10:42 AM 06/01/2024    9:48 AM 02/19/2023    9:37 AM  GAD 7 : Generalized Anxiety Score  Nervous, Anxious, on Edge 3 3 2   Control/stop worrying 0 0 0  Worry too much - different things 1 3 0  Trouble relaxing 0 2 0  Restless 2 3 0  Easily annoyed or irritable 3 3 0  Afraid - awful might happen 3 3 0  Total GAD 7 Score 12 17 2   Anxiety Difficulty Very difficult Very difficult Not difficult at all      07/14/2024   10:41 AM 06/01/2024    9:46 AM 02/19/2023    9:37 AM 12/21/2022    3:07 PM  Depression screen PHQ 2/9  Decreased Interest 3 0 3 1  Down, Depressed, Hopeless 3 3 0 0  PHQ - 2 Score 6 3 3 1   Altered sleeping 3 3 0 0  Tired, decreased energy 0 1 3 0  Change in appetite 1 1 3  0  Feeling bad or failure about yourself  0 1 0 1  Trouble concentrating 2 2 0 0  Moving slowly or fidgety/restless 2 3 0 0  Suicidal thoughts 0 0 0 0  PHQ-9 Score 14 14 9 2   Difficult doing work/chores Somewhat difficult Somewhat difficult Somewhat difficult Not difficult at all      The following portions of the patient's history were reviewed and updated as appropriate: past medical history, past surgical history, family history, social history, allergies, medications, and problem list.   Patient Active Problem  List   Diagnosis Date Noted   Vasomotor symptoms due to menopause 07/14/2024   Mild episode of recurrent major depressive disorder 06/01/2024   Prediabetes 02/21/2024   Adult BMI 36.0-36.9 kg/sq m 12/21/2022   Elevated BP without diagnosis of hypertension 12/21/2022   GAD (generalized anxiety disorder) 11/25/2006   TOBACCO DEPENDENCE 11/25/2006   Past Medical History:  Diagnosis Date   Anxiety    IRREGULAR MENSTRUATION 01/18/2007   Qualifier: Diagnosis of   By: Lavelle CMA,, Thekla       Panic attacks    VERTIGO NOS OR DIZZINESS 11/25/2006   Qualifier: Diagnosis of   By: WATT, JOANNE       Past Surgical History:  Procedure  Laterality Date   CARPAL TUNNEL RELEASE     CESAREAN SECTION     endometrial ablasion     Family History  Problem Relation Age of Onset   Hypertension Mother        age 92 died   Diabetes Mellitus II Mother    Outpatient Medications Prior to Visit  Medication Sig Dispense Refill   ondansetron  (ZOFRAN -ODT) 4 MG disintegrating tablet Take 1 tablet (4 mg total) by mouth every 8 (eight) hours as needed for nausea or vomiting. 20 tablet 0   buPROPion  (WELLBUTRIN  XL) 150 MG 24 hr tablet Take 1 tablet (150 mg total) by mouth daily. 30 tablet 3   No facility-administered medications prior to visit.   Allergies  Allergen Reactions   Codeine Nausea Only     ROS: A complete ROS was performed with pertinent positives/negatives noted in the HPI. The remainder of the ROS are negative.    Objective:   Today's Vitals   07/14/24 1036 07/14/24 1045  BP: (!) 138/94 128/86  Pulse: 87   SpO2: 99%   Weight: 145 lb (65.8 kg)   Height: 5' 1 (1.549 m)     Physical Exam VITALS: BP- 128/90 CARDIOVASCULAR: Blood pressure 128/90.  GENERAL: Well-appearing, in NAD. Well nourished.  SKIN: Pink, warm and dry.  Head: Normocephalic. NECK: Trachea midline. Full ROM w/o pain or tenderness. RESPIRATORY: Chest wall symmetrical. Respirations even and non-labored. MSK: Muscle tone and strength appropriate for age.  NEUROLOGIC: No motor or sensory deficits. Steady, even gait. C2-C12 intact.  PSYCH/MENTAL STATUS: Alert, oriented x 3. Cooperative, mildly anxious mood and affect.   Health Maintenance Due  Topic Date Due   DTaP/Tdap/Td (1 - Tdap) Never done   Hepatitis B Vaccines 19-59 Average Risk (1 of 3 - 19+ 3-dose series) Never done   Fecal DNA (Cologuard)  Never done   Pneumococcal Vaccine: 50+ Years (1 of 1 - PCV) Never done   Zoster Vaccines- Shingrix (1 of 2) Never done   COVID-19 Vaccine (5 - 2025-26 season) 05/29/2024      Assessment & Plan:  1. GAD (generalized anxiety disorder)  (Primary) Discussed options including CBT therapy with local resources provided in her AVS, switching her medication to an SSRI and for incorporating possible hormone therapy to help with irritability likely due to menopausal changes.  Patient declined all options at this time.  She would prefer to increase her Wellbutrin  to 300 mg daily and follow-up in a few months.  Safety plan reviewed with patient and Wellbutrin  increased.  Declined to perform physical today and would like to reschedule in 3 to 4 months.  2. Vasomotor symptoms due to menopause Attempted to discuss benefits of possible hormone therapy with patient, she declined referral at this time.   Meds  ordered this encounter  Medications   buPROPion  (WELLBUTRIN  XL) 300 MG 24 hr tablet    Sig: Take 1 tablet (300 mg total) by mouth daily.    Dispense:  30 tablet    Refill:  5    Supervising Provider:   DE PERU, RAYMOND J [[id number]]    Return in about 4 months (around 11/14/2024) for ANNUAL PHYSICAL.    Patient to reach out to office if new, worrisome, or unresolved symptoms arise or if no improvement in patient's condition. Patient verbalized understanding and is agreeable to treatment plan. All questions answered to patient's satisfaction.    Thersia Schuyler Stark, [place]

## 2024-11-17 ENCOUNTER — Encounter (HOSPITAL_BASED_OUTPATIENT_CLINIC_OR_DEPARTMENT_OTHER): Admitting: Family Medicine
